# Patient Record
Sex: Female | Born: 1960 | Race: White | Hispanic: No | Marital: Married | Smoking: Never smoker
Health system: Southern US, Community
[De-identification: ages and names within clinical notes are randomized; demographics above are authoritative.]

## PROBLEM LIST (undated history)

## (undated) DIAGNOSIS — E785 Hyperlipidemia, unspecified: Secondary | ICD-10-CM

## (undated) DIAGNOSIS — Z8639 Personal history of other endocrine, nutritional and metabolic disease: Secondary | ICD-10-CM

## (undated) DIAGNOSIS — Z148 Genetic carrier of other disease: Secondary | ICD-10-CM

## (undated) DIAGNOSIS — E559 Vitamin D deficiency, unspecified: Secondary | ICD-10-CM

## (undated) DIAGNOSIS — R011 Cardiac murmur, unspecified: Secondary | ICD-10-CM

## (undated) DIAGNOSIS — I341 Nonrheumatic mitral (valve) prolapse: Secondary | ICD-10-CM

## (undated) DIAGNOSIS — C44310 Basal cell carcinoma of skin of unspecified parts of face: Secondary | ICD-10-CM

## (undated) DIAGNOSIS — C44311 Basal cell carcinoma of skin of nose: Secondary | ICD-10-CM

## (undated) DIAGNOSIS — I10 Essential (primary) hypertension: Secondary | ICD-10-CM

## (undated) HISTORY — DX: Vitamin D deficiency, unspecified: E55.9

## (undated) HISTORY — DX: Personal history of other endocrine, nutritional and metabolic disease: Z86.39

## (undated) HISTORY — PX: BASAL CELL CARCINOMA EXCISION: SHX1214

## (undated) HISTORY — DX: Essential (primary) hypertension: I10

## (undated) HISTORY — DX: Hyperlipidemia, unspecified: E78.5

## (undated) HISTORY — DX: Basal cell carcinoma of skin of unspecified parts of face: C44.310

## (undated) HISTORY — DX: Cardiac murmur, unspecified: R01.1

## (undated) HISTORY — DX: Nonrheumatic mitral (valve) prolapse: I34.1

## (undated) HISTORY — DX: Basal cell carcinoma of skin of nose: C44.311

## (undated) HISTORY — DX: Genetic carrier of other disease: Z14.8

---

## 2010-09-05 DIAGNOSIS — I059 Rheumatic mitral valve disease, unspecified: Secondary | ICD-10-CM | POA: Insufficient documentation

## 2011-10-01 DIAGNOSIS — Z85828 Personal history of other malignant neoplasm of skin: Secondary | ICD-10-CM | POA: Insufficient documentation

## 2015-11-28 DIAGNOSIS — Z1501 Genetic susceptibility to malignant neoplasm of breast: Secondary | ICD-10-CM | POA: Insufficient documentation

## 2015-11-29 DIAGNOSIS — IMO0002 Reserved for concepts with insufficient information to code with codable children: Secondary | ICD-10-CM | POA: Insufficient documentation

## 2015-11-29 DIAGNOSIS — Q999 Chromosomal abnormality, unspecified: Secondary | ICD-10-CM | POA: Insufficient documentation

## 2017-08-10 DIAGNOSIS — I10 Essential (primary) hypertension: Secondary | ICD-10-CM | POA: Diagnosis not present

## 2017-08-10 DIAGNOSIS — Z1501 Genetic susceptibility to malignant neoplasm of breast: Secondary | ICD-10-CM | POA: Diagnosis not present

## 2017-08-10 DIAGNOSIS — E041 Nontoxic single thyroid nodule: Secondary | ICD-10-CM | POA: Diagnosis not present

## 2017-08-10 DIAGNOSIS — C4490 Unspecified malignant neoplasm of skin, unspecified: Secondary | ICD-10-CM | POA: Diagnosis not present

## 2017-08-27 DIAGNOSIS — I10 Essential (primary) hypertension: Secondary | ICD-10-CM | POA: Diagnosis not present

## 2017-10-20 DIAGNOSIS — Z1502 Genetic susceptibility to malignant neoplasm of ovary: Secondary | ICD-10-CM | POA: Diagnosis not present

## 2017-10-20 DIAGNOSIS — Z780 Asymptomatic menopausal state: Secondary | ICD-10-CM | POA: Diagnosis not present

## 2017-10-20 DIAGNOSIS — L720 Epidermal cyst: Secondary | ICD-10-CM | POA: Diagnosis not present

## 2017-10-20 DIAGNOSIS — Z85828 Personal history of other malignant neoplasm of skin: Secondary | ICD-10-CM | POA: Diagnosis not present

## 2017-10-20 DIAGNOSIS — L738 Other specified follicular disorders: Secondary | ICD-10-CM | POA: Diagnosis not present

## 2017-10-20 DIAGNOSIS — C4490 Unspecified malignant neoplasm of skin, unspecified: Secondary | ICD-10-CM | POA: Diagnosis not present

## 2017-10-20 DIAGNOSIS — Z1501 Genetic susceptibility to malignant neoplasm of breast: Secondary | ICD-10-CM | POA: Diagnosis not present

## 2017-10-20 DIAGNOSIS — Z1589 Genetic susceptibility to other disease: Secondary | ICD-10-CM | POA: Diagnosis not present

## 2017-10-20 DIAGNOSIS — Z01419 Encounter for gynecological examination (general) (routine) without abnormal findings: Secondary | ICD-10-CM | POA: Diagnosis not present

## 2017-11-12 DIAGNOSIS — Z1231 Encounter for screening mammogram for malignant neoplasm of breast: Secondary | ICD-10-CM | POA: Diagnosis not present

## 2017-11-12 DIAGNOSIS — N6489 Other specified disorders of breast: Secondary | ICD-10-CM | POA: Diagnosis not present

## 2018-04-15 DIAGNOSIS — Z0000 Encounter for general adult medical examination without abnormal findings: Secondary | ICD-10-CM | POA: Diagnosis not present

## 2018-04-15 DIAGNOSIS — I10 Essential (primary) hypertension: Secondary | ICD-10-CM | POA: Diagnosis not present

## 2018-04-15 DIAGNOSIS — Z1501 Genetic susceptibility to malignant neoplasm of breast: Secondary | ICD-10-CM | POA: Insufficient documentation

## 2018-04-15 DIAGNOSIS — E041 Nontoxic single thyroid nodule: Secondary | ICD-10-CM | POA: Diagnosis not present

## 2018-04-15 DIAGNOSIS — Z13220 Encounter for screening for lipoid disorders: Secondary | ICD-10-CM | POA: Diagnosis not present

## 2018-04-15 DIAGNOSIS — E559 Vitamin D deficiency, unspecified: Secondary | ICD-10-CM | POA: Insufficient documentation

## 2018-04-15 DIAGNOSIS — Z23 Encounter for immunization: Secondary | ICD-10-CM | POA: Diagnosis not present

## 2018-04-15 DIAGNOSIS — Z1502 Genetic susceptibility to malignant neoplasm of ovary: Secondary | ICD-10-CM | POA: Insufficient documentation

## 2018-04-28 DIAGNOSIS — E041 Nontoxic single thyroid nodule: Secondary | ICD-10-CM | POA: Diagnosis not present

## 2018-04-28 DIAGNOSIS — Z1502 Genetic susceptibility to malignant neoplasm of ovary: Secondary | ICD-10-CM | POA: Diagnosis not present

## 2018-04-28 DIAGNOSIS — N6489 Other specified disorders of breast: Secondary | ICD-10-CM | POA: Diagnosis not present

## 2018-09-23 DIAGNOSIS — I10 Essential (primary) hypertension: Secondary | ICD-10-CM | POA: Insufficient documentation

## 2018-09-23 DIAGNOSIS — E559 Vitamin D deficiency, unspecified: Secondary | ICD-10-CM

## 2018-09-23 DIAGNOSIS — Z8639 Personal history of other endocrine, nutritional and metabolic disease: Secondary | ICD-10-CM | POA: Insufficient documentation

## 2018-09-23 DIAGNOSIS — I341 Nonrheumatic mitral (valve) prolapse: Secondary | ICD-10-CM

## 2018-09-28 ENCOUNTER — Ambulatory Visit: Payer: Self-pay | Admitting: Family Medicine

## 2019-01-31 ENCOUNTER — Other Ambulatory Visit: Payer: Self-pay

## 2019-02-01 ENCOUNTER — Ambulatory Visit (INDEPENDENT_AMBULATORY_CARE_PROVIDER_SITE_OTHER): Payer: BC Managed Care – PPO | Admitting: Family Medicine

## 2019-02-01 ENCOUNTER — Encounter: Payer: Self-pay | Admitting: Family Medicine

## 2019-02-01 VITALS — BP 123/75 | HR 68 | Temp 97.0°F | Ht 64.0 in | Wt 182.0 lb

## 2019-02-01 DIAGNOSIS — Z85828 Personal history of other malignant neoplasm of skin: Secondary | ICD-10-CM

## 2019-02-01 DIAGNOSIS — Z7689 Persons encountering health services in other specified circumstances: Secondary | ICD-10-CM

## 2019-02-01 DIAGNOSIS — Z6831 Body mass index (BMI) 31.0-31.9, adult: Secondary | ICD-10-CM

## 2019-02-01 DIAGNOSIS — E782 Mixed hyperlipidemia: Secondary | ICD-10-CM | POA: Insufficient documentation

## 2019-02-01 DIAGNOSIS — E785 Hyperlipidemia, unspecified: Secondary | ICD-10-CM | POA: Insufficient documentation

## 2019-02-01 DIAGNOSIS — I10 Essential (primary) hypertension: Secondary | ICD-10-CM

## 2019-02-01 DIAGNOSIS — E66811 Obesity, class 1: Secondary | ICD-10-CM | POA: Insufficient documentation

## 2019-02-01 DIAGNOSIS — Z1509 Genetic susceptibility to other malignant neoplasm: Secondary | ICD-10-CM

## 2019-02-01 DIAGNOSIS — Z1501 Genetic susceptibility to malignant neoplasm of breast: Secondary | ICD-10-CM

## 2019-02-01 DIAGNOSIS — Z1239 Encounter for other screening for malignant neoplasm of breast: Secondary | ICD-10-CM

## 2019-02-01 DIAGNOSIS — E669 Obesity, unspecified: Secondary | ICD-10-CM | POA: Insufficient documentation

## 2019-02-01 DIAGNOSIS — Z8639 Personal history of other endocrine, nutritional and metabolic disease: Secondary | ICD-10-CM

## 2019-02-01 DIAGNOSIS — Z13820 Encounter for screening for osteoporosis: Secondary | ICD-10-CM

## 2019-02-01 DIAGNOSIS — Z1589 Genetic susceptibility to other disease: Secondary | ICD-10-CM

## 2019-02-01 DIAGNOSIS — Z1502 Genetic susceptibility to malignant neoplasm of ovary: Secondary | ICD-10-CM

## 2019-02-01 DIAGNOSIS — E559 Vitamin D deficiency, unspecified: Secondary | ICD-10-CM | POA: Diagnosis not present

## 2019-02-01 MED ORDER — LISINOPRIL-HYDROCHLOROTHIAZIDE 20-25 MG PO TABS: 1.0000 | ORAL_TABLET | Freq: Every day | ORAL | 1 refills | Status: DC

## 2019-02-01 MED ORDER — ATORVASTATIN CALCIUM 20 MG PO TABS: 20.0000 mg | ORAL_TABLET | Freq: Every day | ORAL | 1 refills | Status: DC

## 2019-02-01 NOTE — Patient Instructions (Signed)
It was a pleasure seeing you today, Nijae.  Information regarding what we discussed is included in this packet.  Please make an appointment to see me in 3 months.   In a few days you may receive a survey in the mail or online from [employer] regarding your visit with [place] today. Please take a moment to fill this out. Your feedback is very important to our office. It can help [place] better understand your needs as well as improve your experience and satisfaction. Thank you for taking your time to complete it. We care about you.  Because of recent events of COVID-19 ("Coronavirus"), please follow CDC recommendations:   1. Wash your hand frequently 2. Avoid touching your face 3. Stay away from people who are sick 4. If you have symptoms such as fever, cough, shortness of breath then call your healthcare provider for further guidance 5. If you are sick, STAY AT HOME, unless otherwise directed by your healthcare provider. 6. Follow directions from state and national officials regarding staying safe    Please feel free to call our office if any questions or concerns arise.  Warm Regards, Monia Pouch, FNP-C [facility] [address] [place], [place] [address] [phone]

## 2019-02-01 NOTE — Progress Notes (Signed)
Subjective:  Patient ID: Katherine Pearson, female    DOB: 1961/01/14, 58 y.o.   MRN: [id number]  Patient Care Team: Baruch Gouty, FNP as PCP - General (Family Medicine)   Chief Complaint:  Establish Care, Medical Management of Chronic Issues, and Hypertension   HPI: Katherine Pearson is a 58 y.o. female presenting on 02/01/2019 for Establish Care, Medical Management of Chronic Issues, and Hypertension    1. Encounter to establish care  Pt presents today to establish care. Pt moved to the area from [facility]. Pt was seeing primary care physician at [facility]. Pt states she had previous medical care in [place]. Those records have been requested but have not been received. She said she has had a colonoscopy in the last 10 years. This is not available in Care everywhere. Will request records. Last DEXA was in 2018, results not available. Last PAP on 10/30/2017, negative for malignancy and HPV. Last mammogram 11/23/2017, negative. Last breast MRI 04/29/2018, negative. Mother has a history of breast cancer at age 66 and pt has mutation of CHEK2 gene. It was recommended she have MRI and mammogram yearly, alternating every 6 months. Will order mammogram today.    2. Essential hypertension Complaint with meds - Yes Checking BP at home - No Exercising Regularly - No Watching Salt intake - Yes Pertinent ROS:  Headache - No Chest pain - No Dyspnea - No Palpitations - No LE edema - No They report good compliance with medications and can restate their regimen by memory. No medication side effects.  BP Readings from Last 3 Encounters:  02/01/19 123/75    3. History of thyroid nodule  History of thyroid nodule. Due for repeat US on 04/29/2019. Last [place] on 04/28/2018 revealed a 0.9 x 0.9 x 0.9 cm calcified nodule in the right interpolar thyroid. Pt denies weight changes. No hot or cold intolerance. No mood swings. No hair, skin, or nail changes. No fatigue or insomnia. No constipation or  diarrhea.    4. Vitamin D deficiency  Daily oral repletion therapy. No fatigue, muscle weakness, fractures, or arthralgias.    5. Mixed hyperlipidemia  On statin without associated side effects. Does try to watch diet. Does not exercise on a regular basis.    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 Pt had genetic testing that was positive for CHEK2 mutation. Pt would like referral to GYN for her PAPs, last on 10/20/2017, negative for malignancy and HPV. Pt states she would prefer to see GYN for this. She is due for her yearly mammogram, will order today. No vaginal complaints. No breast changes, pain, or discharge. No fever, chills, diaphoresis, or weight loss.    7. History of primary malignant neoplasm of skin  BCC removed from nasal sidewall in 2013. No new or concerning skin lesions. Would like referral to dermatology for yearly screening.      Relevant past medical, surgical, family, and social history reviewed and updated as indicated.  Allergies and medications reviewed and updated. Date reviewed: Chart in Epic.   Past Medical History:  Diagnosis Date  . Basal cell carcinoma (BCC) of nasal sidewall   . 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 Heart murmur   . History of thyroid nodule    benign, left  . Hyperlipidemia   . Hypertension   . Mitral valve prolapse   . Vitamin D deficiency  Past Surgical History:  Procedure Laterality Date  . BASAL CELL CARCINOMA EXCISION      Social History   Socioeconomic History  . Marital status: Married    Spouse name: Gwyndolyn Saxon  . Number of children: 2  . Years of education: Not on file  . Highest education level: Not on file  Occupational History  . Not on file  Social Needs  . Financial resource strain: Not on file  . Food insecurity    Worry: Not on file    Inability: Not on file  . Transportation needs    Medical: Not on file    Non-medical: Not on file   Tobacco Use  . Smoking status: Never Smoker  . Smokeless tobacco: Never Used  Substance and Sexual Activity  . Alcohol use: Yes    Comment: social  . Drug use: Never  . Sexual activity: Not on file  Lifestyle  . Physical activity    Days per week: Not on file    Minutes per session: Not on file  . Stress: Not on file  Relationships  . Social Herbalist on phone: Not on file    Gets together: Not on file    Attends religious service: Not on file    Active member of club or organization: Not on file    Attends meetings of clubs or organizations: Not on file    Relationship status: Not on file  . Intimate partner violence    Fear of current or ex partner: Not on file    Emotionally abused: Not on file    Physically abused: Not on file    Forced sexual activity: Not on file  Other Topics Concern  . Not on file  Social History Narrative  . Not on file    Outpatient Encounter Medications as of 02/01/2019  Medication Sig  . atorvastatin (LIPITOR) 20 MG tablet Take 1 tablet (20 mg total) by mouth daily.  . Bilberry 100 MG CAPS Take by mouth.  . Calcium Carb-Cholecalciferol (CALCIUM 600 + D PO) Take by mouth. One a day - D is 800  . lisinopril-hydrochlorothiazide (ZESTORETIC) 20-25 MG tablet Take 1 tablet by mouth daily.  . Multiple Vitamins-Minerals (MULTIVITAMIN ADULT PO) multivitamin  . vitamin C (ASCORBIC ACID) 500 MG tablet Take 500 mg by mouth daily.  . [DISCONTINUED] atorvastatin (LIPITOR) 20 MG tablet Take 1 tablet by mouth daily.  . [DISCONTINUED] lisinopril-hydrochlorothiazide (ZESTORETIC) 20-25 MG tablet Take 1 tablet by mouth daily.   No facility-administered encounter medications on file as of 02/01/2019.     Allergies  Allergen Reactions  . Cefuroxime Axetil Rash    Review of Systems  Constitutional: Negative for activity change, appetite change, chills, diaphoresis, fatigue, fever and unexpected weight change.  HENT: Negative for congestion.    Eyes: Negative for photophobia and visual disturbance.  Respiratory: Negative for cough, chest tightness, shortness of breath and wheezing.   Cardiovascular: Negative for chest pain, palpitations and leg swelling.  Gastrointestinal: Negative for abdominal distention, abdominal pain, anal bleeding, blood in stool, constipation, diarrhea, nausea, rectal pain and vomiting.  Endocrine: Negative for cold intolerance, heat intolerance, polydipsia, polyphagia and polyuria.  Genitourinary: Negative for decreased urine volume, difficulty urinating, dyspareunia, dysuria, enuresis, flank pain, frequency, genital sores, hematuria, menstrual problem, pelvic pain, urgency, vaginal bleeding, vaginal discharge and vaginal pain.  Musculoskeletal: Negative for arthralgias, joint swelling and myalgias.  Skin: Negative for color change, pallor and wound.  Neurological: Negative for dizziness, tremors, seizures,  syncope, facial asymmetry, speech difficulty, weakness, light-headedness, numbness and headaches.  Hematological: Negative for adenopathy.  Psychiatric/Behavioral: Negative for confusion and sleep disturbance.  All other systems reviewed and are negative.       Objective:  BP 123/75   Pulse 68   Temp (!) 97 F (36.1 C) (Oral)   Ht _0  (1.626 m)   Wt 182 lb (82.6 kg)   BMI 31.24 kg/m    Wt Readings from Last 3 Encounters:  02/01/19 182 lb (82.6 kg)    Physical Exam Vitals signs and nursing note reviewed.  Constitutional:      General: She is not in acute distress.    Appearance: Normal appearance. She is well-developed and well-groomed. She is obese. She is not ill-appearing, toxic-appearing or diaphoretic.  HENT:     Head: Normocephalic and atraumatic.     Jaw: There is normal jaw occlusion.     Right Ear: Hearing normal.     Left Ear: Hearing normal.     Nose: Nose normal.     Mouth/Throat:     Lips: Pink.     Mouth: Mucous membranes are moist.     Pharynx: Oropharynx is clear.  Uvula midline.  Eyes:     General: Lids are normal.     Extraocular Movements: Extraocular movements intact.     Conjunctiva/sclera: Conjunctivae normal.     Pupils: Pupils are equal, round, and reactive to light.  Neck:     Musculoskeletal: Normal range of motion and neck supple.     Thyroid: Thyroid mass (small, solid mass, right mid thyroid, nontender) present. No thyromegaly or thyroid tenderness.     Vascular: No carotid bruit or JVD.     Trachea: Trachea and phonation normal.  Cardiovascular:     Rate and Rhythm: Normal rate and regular rhythm.     Chest Wall: PMI is not displaced.     Pulses: Normal pulses.     Heart sounds: Normal heart sounds. No murmur. No friction rub. No gallop.   Pulmonary:     Effort: Pulmonary effort is normal. No respiratory distress.     Breath sounds: Normal breath sounds. No wheezing.  Chest:     Breasts: Breasts are symmetrical.        Right: Normal.        Left: Normal.  Abdominal:     General: Bowel sounds are normal. There is no distension or abdominal bruit.     Palpations: Abdomen is soft. There is no hepatomegaly or splenomegaly.     Tenderness: There is no abdominal tenderness. There is no right CVA tenderness or left CVA tenderness.     Hernia: No hernia is present.  Musculoskeletal: Normal range of motion.     Right lower leg: No edema.     Left lower leg: No edema.  Lymphadenopathy:     Cervical: No cervical adenopathy.     Upper Body:     Right upper body: No supraclavicular or axillary adenopathy.     Left upper body: No supraclavicular or axillary adenopathy.  Skin:    General: Skin is warm and dry.     Capillary Refill: Capillary refill takes less than 2 seconds.     Coloration: Skin is not cyanotic, jaundiced or pale.     Findings: No rash.  Neurological:     General: No focal deficit present.     Mental Status: She is alert and oriented to person, place, and time.  Cranial Nerves: Cranial nerves are intact.      Sensory: Sensation is intact.     Motor: Motor function is intact.     Coordination: Coordination is intact.     Gait: Gait is intact.     Deep Tendon Reflexes: Reflexes are normal and symmetric.  Psychiatric:        Attention and Perception: Attention and perception normal.        Mood and Affect: Mood and affect normal.        Speech: Speech normal.        Behavior: Behavior normal. Behavior is cooperative.        Thought Content: Thought content normal.        Cognition and Memory: Cognition and memory normal.        Judgment: Judgment normal.     Results for orders placed or performed in visit on 02/01/19  CMP14+EGFR  Result Value Ref Range   Glucose 101 (H) 65 - 99 mg/dL   BUN 20 6 - 24 mg/dL   Creatinine, Ser 1.03 (H) 0.57 - 1.00 mg/dL   GFR calc non Af Amer 60 >59 mL/min/1.73   GFR calc Af Amer 69 >59 mL/min/1.73   BUN/Creatinine Ratio 19 9 - 23   Sodium 141 134 - 144 mmol/L   Potassium 3.7 3.5 - 5.2 mmol/L   Chloride 96 96 - 106 mmol/L   CO2 31 (H) 20 - 29 mmol/L   Calcium 9.8 8.7 - 10.2 mg/dL   Total Protein 7.4 6.0 - 8.5 g/dL   Albumin 4.6 3.8 - 4.9 g/dL   Globulin, Total 2.8 1.5 - 4.5 g/dL   Albumin/Globulin Ratio 1.6 1.2 - 2.2   Bilirubin Total 0.2 0.0 - 1.2 mg/dL   Alkaline Phosphatase 47 39 - 117 IU/L   AST 21 0 - 40 IU/L   ALT 22 0 - 32 IU/L  CBC with Differential/Platelet  Result Value Ref Range   WBC 6.1 3.4 - 10.8 x10E3/uL   RBC 4.63 3.77 - 5.28 x10E6/uL   Hemoglobin 14.4 11.1 - 15.9 g/dL   Hematocrit 42.1 34.0 - 46.6 %   MCV 91 79 - 97 fL   MCH 31.1 26.6 - 33.0 pg   MCHC 34.2 31.5 - 35.7 g/dL   RDW 12.0 11.7 - 15.4 %   Platelets 217 150 - 450 x10E3/uL   Neutrophils 45 Not Estab. %   Lymphs 42 Not Estab. %   Monocytes 10 Not Estab. %   Eos 2 Not Estab. %   Basos 1 Not Estab. %   Neutrophils Absolute 2.8 1.4 - 7.0 x10E3/uL   Lymphocytes Absolute 2.6 0.7 - 3.1 x10E3/uL   Monocytes Absolute 0.6 0.1 - 0.9 x10E3/uL   EOS (ABSOLUTE) 0.1 0.0 - 0.4  x10E3/uL   Basophils Absolute 0.0 0.0 - 0.2 x10E3/uL   Immature Granulocytes 0 Not Estab. %   Immature Grans (Abs) 0.0 0.0 - 0.1 x10E3/uL  Lipid panel  Result Value Ref Range   Cholesterol, Total WILL FOLLOW    Triglycerides WILL FOLLOW    HDL WILL FOLLOW    VLDL Cholesterol Cal WILL FOLLOW    LDL Calculated WILL FOLLOW    Comment: WILL FOLLOW    Chol/HDL Ratio WILL FOLLOW   Thyroid Panel With TSH  Result Value Ref Range   TSH WILL FOLLOW    T4, Total WILL FOLLOW    T3 Uptake Ratio WILL FOLLOW    Free Thyroxine Index WILL FOLLOW  VITAMIN D 25 Hydroxy (Vit-D Deficiency, Fractures)  Result Value Ref Range   Vit D, 25-Hydroxy WILL FOLLOW        Pertinent labs & imaging results that were available during my care of the patient were reviewed by me and considered in my medical decision making.  Assessment & Plan:  Leota was seen today for establish care, medical management of chronic issues and hypertension.  Diagnoses and all orders for this visit:  Encounter to establish care  Essential hypertension DASH diet and exercise encouraged. Labs pending. Continue medications as prescribed. Follow up in 3 months.  -     lisinopril-hydrochlorothiazide (ZESTORETIC) 20-25 MG tablet; Take 1 tablet by mouth daily. -     CMP14+EGFR -     CBC with Differential/Platelet -     Thyroid Panel With TSH  History of thyroid nodule Asymptomatic. Last [place] 04/28/2018, due for follow up US October 2020.  -     Thyroid Panel With TSH  Vitamin D deficiency On daily oral repletion. Will check levels today. Due for DEXA, will order. Continue repletion therapy.  -     VITAMIN D 25 Hydroxy (Vit-D Deficiency, Fractures)  Mixed hyperlipidemia Diet and exercise encouraged. Labs pending. Continue medications as prescribed.  -     atorvastatin (LIPITOR) 20 MG tablet; Take 1 tablet (20 mg total) by mouth daily. -     CMP14+EGFR -     Lipid panel  Monoallelic mutation of CHEK2 gene in female patient Pt  requested referral to GYN in area for yearly evaluation and PAP. Referral placed. Due for mammogram, order placed.  -     Ambulatory referral to Obstetrics / Gynecology -     MM 3D SCREEN BREAST BILATERAL; Future  History of primary malignant neoplasm of skin -     Ambulatory referral to Dermatology  Screening for breast cancer -     MM 3D SCREEN BREAST BILATERAL; Future  BMI 31.0-31.9,adult Diet and exercise encouraged. Follow up BMI in 3 months.   Counseling on health maintenance:  Healthy lifestyle principles reviewed (do not smoke; BMI < 30; at least 150 minutes of aerobic exercise per week; 5 servings of fruits or vegetables daily).  Counseled on mammography screening. Recommended adequate intake of calcium and vitamin D daily.  Counseled on DEXA scan screening for osteoporosis.  Counseled on colonoscopy screening and PAP for cervical cancer screening.  Recommend to use sunscreen and wear seatbelt. Recommend safe sex practices and use of condoms.   Continue all other maintenance medications.  Follow up plan: Return in about 3 months (around 05/04/2019), or if symptoms worsen or fail to improve, for HTN, lipids.  Educational handout given for survey  The above assessment and management plan was discussed with the patient. The patient verbalized understanding of and has agreed to the management plan. Patient is aware to call the clinic if symptoms persist or worsen. Patient is aware when to return to the clinic for a follow-up visit. Patient educated on when it is appropriate to go to the emergency department.   Monia Pouch, FNP-C [facility] [phone] 02/01/19

## 2019-02-02 ENCOUNTER — Encounter: Payer: Self-pay | Admitting: Family Medicine

## 2019-02-02 LAB — CBC WITH DIFFERENTIAL/PLATELET
Basophils Absolute: 0 10*3/uL (ref 0.0–0.2)
Basos: 1 %
EOS (ABSOLUTE): 0.1 10*3/uL (ref 0.0–0.4)
Eos: 2 %
Hematocrit: 42.1 % (ref 34.0–46.6)
Hemoglobin: 14.4 g/dL (ref 11.1–15.9)
Immature Grans (Abs): 0 10*3/uL (ref 0.0–0.1)
Immature Granulocytes: 0 %
Lymphocytes Absolute: 2.6 10*3/uL (ref 0.7–3.1)
Lymphs: 42 %
MCH: 31.1 pg (ref 26.6–33.0)
MCHC: 34.2 g/dL (ref 31.5–35.7)
MCV: 91 fL (ref 79–97)
Monocytes Absolute: 0.6 10*3/uL (ref 0.1–0.9)
Monocytes: 10 %
Neutrophils Absolute: 2.8 10*3/uL (ref 1.4–7.0)
Neutrophils: 45 %
Platelets: 217 10*3/uL (ref 150–450)
RBC: 4.63 x10E6/uL (ref 3.77–5.28)
RDW: 12 % (ref 11.7–15.4)
WBC: 6.1 10*3/uL (ref 3.4–10.8)

## 2019-02-02 LAB — CMP14+EGFR
ALT: 22 IU/L (ref 0–32)
AST: 21 IU/L (ref 0–40)
Albumin/Globulin Ratio: 1.6 (ref 1.2–2.2)
Albumin: 4.6 g/dL (ref 3.8–4.9)
Alkaline Phosphatase: 47 IU/L (ref 39–117)
BUN/Creatinine Ratio: 19 (ref 9–23)
BUN: 20 mg/dL (ref 6–24)
Bilirubin Total: 0.2 mg/dL (ref 0.0–1.2)
CO2: 31 mmol/L — ABNORMAL HIGH (ref 20–29)
Calcium: 9.8 mg/dL (ref 8.7–10.2)
Chloride: 96 mmol/L (ref 96–106)
Creatinine, Ser: 1.03 mg/dL — ABNORMAL HIGH (ref 0.57–1.00)
GFR calc Af Amer: 69 mL/min/{1.73_m2} (ref >59)
GFR calc non Af Amer: 60 mL/min/{1.73_m2} (ref >59)
Globulin, Total: 2.8 g/dL (ref 1.5–4.5)
Glucose: 101 mg/dL — ABNORMAL HIGH (ref 65–99)
Potassium: 3.7 mmol/L (ref 3.5–5.2)
Sodium: 141 mmol/L (ref 134–144)
Total Protein: 7.4 g/dL (ref 6.0–8.5)

## 2019-02-02 LAB — LIPID PANEL
Chol/HDL Ratio: 3.3 ratio (ref 0.0–4.4)
Cholesterol, Total: 190 mg/dL (ref 100–199)
HDL: 58 mg/dL (ref >39)
LDL Calculated: 112 mg/dL — ABNORMAL HIGH (ref 0–99)
Triglycerides: 98 mg/dL (ref 0–149)
VLDL Cholesterol Cal: 20 mg/dL (ref 5–40)

## 2019-02-02 LAB — THYROID PANEL WITH TSH
Free Thyroxine Index: 2 (ref 1.2–4.9)
T3 Uptake Ratio: 24 % (ref 24–39)
T4, Total: 8.3 ug/dL (ref 4.5–12.0)
TSH: 1.69 u[IU]/mL (ref 0.450–4.500)

## 2019-02-02 LAB — VITAMIN D 25 HYDROXY (VIT D DEFICIENCY, FRACTURES): Vit D, 25-Hydroxy: 48.8 ng/mL (ref 30.0–100.0)

## 2019-02-16 ENCOUNTER — Encounter: Payer: BC Managed Care – PPO | Admitting: Obstetrics and Gynecology

## 2019-02-16 DIAGNOSIS — L821 Other seborrheic keratosis: Secondary | ICD-10-CM | POA: Diagnosis not present

## 2019-02-16 DIAGNOSIS — D225 Melanocytic nevi of trunk: Secondary | ICD-10-CM | POA: Diagnosis not present

## 2019-02-16 DIAGNOSIS — L308 Other specified dermatitis: Secondary | ICD-10-CM | POA: Diagnosis not present

## 2019-02-16 DIAGNOSIS — L728 Other follicular cysts of the skin and subcutaneous tissue: Secondary | ICD-10-CM | POA: Diagnosis not present

## 2019-03-17 ENCOUNTER — Other Ambulatory Visit: Payer: Self-pay

## 2019-03-18 ENCOUNTER — Encounter: Payer: Self-pay | Admitting: Family Medicine

## 2019-03-18 ENCOUNTER — Ambulatory Visit: Payer: BC Managed Care – PPO | Admitting: Family Medicine

## 2019-03-18 VITALS — BP 116/76 | HR 76 | Temp 98.2°F | Ht 64.0 in | Wt 180.0 lb

## 2019-03-18 DIAGNOSIS — Z0001 Encounter for general adult medical examination with abnormal findings: Secondary | ICD-10-CM

## 2019-03-18 DIAGNOSIS — Z23 Encounter for immunization: Secondary | ICD-10-CM

## 2019-03-18 DIAGNOSIS — Z1211 Encounter for screening for malignant neoplasm of colon: Secondary | ICD-10-CM

## 2019-03-18 DIAGNOSIS — N898 Other specified noninflammatory disorders of vagina: Secondary | ICD-10-CM

## 2019-03-18 DIAGNOSIS — Z13220 Encounter for screening for lipoid disorders: Secondary | ICD-10-CM

## 2019-03-18 DIAGNOSIS — Z0000 Encounter for general adult medical examination without abnormal findings: Secondary | ICD-10-CM

## 2019-03-18 DIAGNOSIS — E041 Nontoxic single thyroid nodule: Secondary | ICD-10-CM | POA: Diagnosis not present

## 2019-03-18 DIAGNOSIS — Z130 Encounter for screening for diseases of the blood and blood-forming organs and certain disorders involving the immune mechanism: Secondary | ICD-10-CM

## 2019-03-18 DIAGNOSIS — Z13228 Encounter for screening for other metabolic disorders: Secondary | ICD-10-CM | POA: Diagnosis not present

## 2019-03-18 DIAGNOSIS — Z1329 Encounter for screening for other suspected endocrine disorder: Secondary | ICD-10-CM

## 2019-03-18 DIAGNOSIS — Z124 Encounter for screening for malignant neoplasm of cervix: Secondary | ICD-10-CM

## 2019-03-18 DIAGNOSIS — Z6831 Body mass index (BMI) 31.0-31.9, adult: Secondary | ICD-10-CM | POA: Diagnosis not present

## 2019-03-18 DIAGNOSIS — Z1212 Encounter for screening for malignant neoplasm of rectum: Secondary | ICD-10-CM

## 2019-03-18 DIAGNOSIS — Z1239 Encounter for other screening for malignant neoplasm of breast: Secondary | ICD-10-CM

## 2019-03-18 LAB — WET PREP FOR TRICH, YEAST, CLUE
Clue Cell Exam: NEGATIVE
Trichomonas Exam: NEGATIVE
Yeast Exam: NEGATIVE

## 2019-03-18 NOTE — Patient Instructions (Signed)
Health Maintenance, Female Adopting a healthy lifestyle and getting preventive care are important in promoting health and wellness. Ask your health care provider about:  The right schedule for you to have regular tests and exams.  Things you can do on your own to prevent diseases and keep yourself healthy. What should I know about diet, weight, and exercise? Eat a healthy diet   Eat a diet that includes plenty of vegetables, fruits, low-fat dairy products, and lean protein.  Do not eat a lot of foods that are high in solid fats, added sugars, or sodium. Maintain a healthy weight Body mass index (BMI) is used to identify weight problems. It estimates body fat based on height and weight. Your health care provider can help determine your BMI and help you achieve or maintain a healthy weight. Get regular exercise Get regular exercise. This is one of the most important things you can do for your health. Most adults should:  Exercise for at least 150 minutes each week. The exercise should increase your heart rate and make you sweat (moderate-intensity exercise).  Do strengthening exercises at least twice a week. This is in addition to the moderate-intensity exercise.  Spend less time sitting. Even light physical activity can be beneficial. Watch cholesterol and blood lipids Have your blood tested for lipids and cholesterol at 58 years of age, then have this test every 5 years. Have your cholesterol levels checked more often if:  Your lipid or cholesterol levels are high.  You are older than 58 years of age.  You are at high risk for heart disease. What should I know about cancer screening? Depending on your health history and family history, you may need to have cancer screening at various ages. This may include screening for:  Breast cancer.  Cervical cancer.  Colorectal cancer.  Skin cancer.  Lung cancer. What should I know about heart disease, diabetes, and high blood  pressure? Blood pressure and heart disease  High blood pressure causes heart disease and increases the risk of stroke. This is more likely to develop in people who have high blood pressure readings, are of African descent, or are overweight.  Have your blood pressure checked: ? Every 3-5 years if you are 54-9 years of age. ? Every year if you are 69 years old or older. Diabetes Have regular diabetes screenings. This checks your fasting blood sugar level. Have the screening done:  Once every three years after age 36 if you are at a normal weight and have a low risk for diabetes.  More often and at a younger age if you are overweight or have a high risk for diabetes. What should I know about preventing infection? Hepatitis B If you have a higher risk for hepatitis B, you should be screened for this virus. Talk with your health care provider to find out if you are at risk for hepatitis B infection. Hepatitis C Testing is recommended for:  Everyone born from [birth year] through 1965.  Anyone with known risk factors for hepatitis C. Sexually transmitted infections (STIs)  Get screened for STIs, including gonorrhea and chlamydia, if: ? You are sexually active and are younger than 58 years of age. ? You are older than 58 years of age and your health care provider tells you that you are at risk for this type of infection. ? Your sexual activity has changed since you were last screened, and you are at increased risk for chlamydia or gonorrhea. Ask your health care provider  if you are at risk.  Ask your health care provider about whether you are at high risk for HIV. Your health care provider may recommend a prescription medicine to help prevent HIV infection. If you choose to take medicine to prevent HIV, you should first get tested for HIV. You should then be tested every 3 months for as long as you are taking the medicine. Pregnancy  If you are about to stop having your period (premenopausal) and  you may become pregnant, seek counseling before you get pregnant.  Take 400 to 800 micrograms (mcg) of folic acid every day if you become pregnant.  Ask for birth control (contraception) if you want to prevent pregnancy. Osteoporosis and menopause Osteoporosis is a disease in which the bones lose minerals and strength with aging. This can result in bone fractures. If you are 60 years old or older, or if you are at risk for osteoporosis and fractures, ask your health care provider if you should:  Be screened for bone loss.  Take a calcium or vitamin D supplement to lower your risk of fractures.  Be given hormone replacement therapy (HRT) to treat symptoms of menopause. Follow these instructions at home: Lifestyle  Do not use any products that contain nicotine or tobacco, such as cigarettes, e-cigarettes, and chewing tobacco. If you need help quitting, ask your health care provider.  Do not use street drugs.  Do not share needles.  Ask your health care provider for help if you need support or information about quitting drugs. Alcohol use  Do not drink alcohol if: ? Your health care provider tells you not to drink. ? You are pregnant, may be pregnant, or are planning to become pregnant.  If you drink alcohol: ? Limit how much you use to 0-1 drink a day. ? Limit intake if you are breastfeeding.  Be aware of how much alcohol is in your drink. In the [place] one drink equals one 12 oz bottle of beer (355 mL), one 5 oz glass of wine (148 mL), or one 1 oz glass of hard liquor (44 mL). General instructions  Schedule regular health, dental, and eye exams.  Stay current with your vaccines.  Tell your health care provider if: ? You often feel depressed. ? You have ever been abused or do not feel safe at home. Summary  Adopting a healthy lifestyle and getting preventive care are important in promoting health and wellness.  Follow your health care provider's instructions about healthy  diet, exercising, and getting tested or screened for diseases.  Follow your health care provider's instructions on monitoring your cholesterol and blood pressure. This information is not intended to replace advice given to you by your health care provider. Make sure you discuss any questions you have with your health care provider. Document Released: 01/13/2011 Document Revised: 06/23/2018 Document Reviewed: 06/23/2018 Elsevier Patient Education  2020 [employer].   Colonoscopy, Adult A colonoscopy is an exam to look at the entire large intestine. During the exam, a lubricated, flexible tube that has a camera on the end of it is inserted into the anus and then passed into the rectum, colon, and other parts of the large intestine. You may have a colonoscopy as a part of normal colorectal screening or if you have certain symptoms, such as:  Lack of red blood cells (anemia).  Diarrhea that does not go away.  Abdominal pain.  Blood in your stool (feces). A colonoscopy can help screen for and diagnose medical problems, including:  Tumors.  Polyps.  Inflammation.  Areas of bleeding. Tell a health care provider about:  Any allergies you have.  All medicines you are taking, including vitamins, herbs, eye drops, creams, and over-the-counter medicines.  Any problems you or family members have had with anesthetic medicines.  Any blood disorders you have.  Any surgeries you have had.  Any medical conditions you have.  Any problems you have had passing stool. What are the risks? Generally, this is a safe procedure. However, problems may occur, including:  Bleeding.  A tear in the intestine.  A reaction to medicines given during the exam.  Infection (rare). What happens before the procedure? Eating and drinking restrictions Follow instructions from your health care provider about eating and drinking, which may include:  A few days before the procedure - follow a low-fiber  diet. Avoid nuts, seeds, dried fruit, raw fruits, and vegetables.  1-3 days before the procedure - follow a clear liquid diet. Drink only clear liquids, such as clear broth or bouillon, black coffee or tea, clear juice, clear soft drinks or sports drinks, gelatin dessert, and popsicles. Avoid any liquids that contain red or purple dye.  On the day of the procedure - do not eat or drink anything starting 2 hours before the procedure, or within the time period that your health care provider recommends. Up to 2 hours before the procedure, you may continue to drink clear liquids, such as water or clear fruit juice. Bowel prep If you were prescribed an oral bowel prep to clean out your colon:  Take it as told by your health care provider. Starting the day before your procedure, you will need to drink a large amount of medicated liquid. The liquid will cause you to have multiple loose stools until your stool is almost clear or light green.  If your skin or anus gets irritated from diarrhea, you may use these to relieve the irritation: ? Medicated wipes, such as adult wet wipes with aloe and vitamin E. ? A skin-soothing product like petroleum jelly.  If you vomit while drinking the bowel prep, take a break for up to 60 minutes and then begin the bowel prep again. If vomiting continues and you cannot take the bowel prep without vomiting, call your health care provider.  To clean out your colon, you may also be given: ? Laxative medicines. ? Instructions about how to use an enema. General instructions  Ask your health care provider about: ? Changing or stopping your regular medicines or supplements. This is especially important if you are taking iron supplements, diabetes medicines, or blood thinners. ? Taking medicines such as aspirin and ibuprofen. These medicines can thin your blood. Do not take these medicines before the procedure if your health care provider tells you not to.  Plan to have  someone take you home from the hospital or clinic. What happens during the procedure?   An IV may be inserted into one of your veins.  You will be given medicine to help you relax (sedative).  To reduce your risk of infection: ? Your health care team will wash or sanitize their hands. ? Your anal area will be washed with soap.  You will be asked to lie on your side with your knees bent.  Your health care provider will lubricate a long, thin, flexible tube. The tube will have a camera and a light on the end.  The tube will be inserted into your anus.  The tube will be gently  eased through your rectum and colon.  Air will be delivered into your colon to keep it open. You may feel some pressure or cramping.  The camera will be used to take images during the procedure.  A small tissue sample may be removed to be examined under a microscope (biopsy).  If small polyps are found, your health care provider may remove them and have them checked for cancer cells.  When the exam is done, the tube will be removed. The procedure may vary among health care providers and hospitals. What happens after the procedure?  Your blood pressure, heart rate, breathing rate, and blood oxygen level will be monitored until the medicines you were given have worn off.  Do not drive for 24 hours after the exam.  You may have a small amount of blood in your stool.  You may pass gas and have mild abdominal cramping or bloating due to the air that was used to inflate your colon during the exam.  It is up to you to get the results of your procedure. Ask your health care provider, or the department performing the procedure, when your results will be ready. Summary  A colonoscopy is an exam to look at the entire large intestine.  During a colonoscopy, a lubricated, flexible tube with a camera on the end of it is inserted into the anus and then passed into the colon and other parts of the large intestine.   Follow instructions from your health care provider about eating and drinking before the procedure.  If you were prescribed an oral bowel prep to clean out your colon, take it as told by your health care provider.  After your procedure, your blood pressure, heart rate, breathing rate, and blood oxygen level will be monitored until the medicines you were given have worn off. This information is not intended to replace advice given to you by your health care provider. Make sure you discuss any questions you have with your health care provider. Document Released: 06/27/2000 Document Revised: 04/22/2017 Document Reviewed: 09/11/2015 Elsevier Patient Education  2020 [employer].

## 2019-03-18 NOTE — Progress Notes (Signed)
Subjective:  Patient ID: Katherine Pearson, female    DOB: [birth year], 58 y.o.   MRN: [id number]  Patient Care Team: Baruch Gouty, FNP as PCP - General (Family Medicine)   Chief Complaint:  Gynecologic Exam and Annual Exam   HPI: Katherine Pearson is a 58 y.o. female presenting on 03/18/2019 for Gynecologic Exam and Annual Exam   Pt presents today for has annual physical exam and preventative health maintenance. Pt has been doing well. She is taking her medications as prescribed without associated side effects. She denies high or low blood pressure readings. No chest pain, leg swelling, shortness of breath, headaches, confusion, weakness, dizziness, or visual changes. She does try to watch her diet and is active daily. She has a noted right thyroid nodule that is followed by [place] yearly and it is time for this. She denies increase in size of nodule. No thyroid associated symptoms. No trouble swallowing or voice changes. She does report slight vaginal discharge that is white in color. States this started a few days ago and has not worsened. Denies new sexual partner or recent antibiotic use. No changes in hygiene products. No pain, bleeding, pruritis, or odor. No dyspareunia, abdominal pain, or pelvic pain.     Relevant past medical, surgical, family, and social history reviewed and updated as indicated.  Allergies and medications reviewed and updated. Date reviewed: Chart in Epic.   Past Medical History:  Diagnosis Date  . Basal cell carcinoma (BCC) of nasal sidewall   . 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 Heart murmur   . History of thyroid nodule    benign, left  . Hyperlipidemia   . Hypertension   . Mitral valve prolapse   . Vitamin D deficiency     Past Surgical History:  Procedure Laterality Date  . BASAL CELL CARCINOMA EXCISION      Social History   Socioeconomic History  . Marital status: Married    Spouse name: Gwyndolyn Saxon  . Number of children: 2  . Years of  education: Not on file  . Highest education level: Not on file  Occupational History  . Not on file  Social Needs  . Financial resource strain: Not on file  . Food insecurity    Worry: Not on file    Inability: Not on file  . Transportation needs    Medical: Not on file    Non-medical: Not on file  Tobacco Use  . Smoking status: Never Smoker  . Smokeless tobacco: Never Used  Substance and Sexual Activity  . Alcohol use: Yes    Comment: social  . Drug use: Never  . Sexual activity: Not on file  Lifestyle  . Physical activity    Days per week: Not on file    Minutes per session: Not on file  . Stress: Not on file  Relationships  . Social Herbalist on phone: Not on file    Gets together: Not on file    Attends religious service: Not on file    Active member of club or organization: Not on file    Attends meetings of clubs or organizations: Not on file    Relationship status: Not on file  . Intimate partner violence    Fear of current or ex partner: Not on file    Emotionally abused: Not on file    Physically abused: Not on file    Forced sexual activity: Not on  file  Other Topics Concern  . Not on file  Social History Narrative  . Not on file    Outpatient Encounter Medications as of 03/18/2019  Medication Sig  . atorvastatin (LIPITOR) 20 MG tablet Take 1 tablet (20 mg total) by mouth daily.  . Bilberry 100 MG CAPS Take by mouth.  . Calcium Carb-Cholecalciferol (CALCIUM 600 + D PO) Take by mouth. One a day - D is 800  . lisinopril-hydrochlorothiazide (ZESTORETIC) 20-25 MG tablet Take 1 tablet by mouth daily.  . Multiple Vitamins-Minerals (MULTIVITAMIN ADULT PO) multivitamin  . vitamin C (ASCORBIC ACID) 500 MG tablet Take 500 mg by mouth daily.   No facility-administered encounter medications on file as of 03/18/2019.     Allergies  Allergen Reactions  . Cefuroxime Axetil Rash    Review of Systems  Constitutional: Negative for activity change, appetite  change, chills, diaphoresis, fatigue, fever and unexpected weight change.  HENT: Negative.   Eyes: Negative.  Negative for photophobia and visual disturbance.  Respiratory: Negative for cough, chest tightness and shortness of breath.   Cardiovascular: Negative for chest pain, palpitations and leg swelling.  Gastrointestinal: Negative for abdominal distention, abdominal pain, anal bleeding, blood in stool, constipation, diarrhea, nausea, rectal pain and vomiting.  Endocrine: Negative.  Negative for cold intolerance, heat intolerance, polydipsia, polyphagia and polyuria.  Genitourinary: Positive for vaginal discharge. Negative for decreased urine volume, difficulty urinating, dyspareunia, dysuria, enuresis, flank pain, frequency, genital sores, hematuria, pelvic pain, urgency, vaginal bleeding and vaginal pain.  Musculoskeletal: Negative for arthralgias, back pain and myalgias.  Skin: Negative.   Allergic/Immunologic: Negative.   Neurological: Negative for dizziness, tremors, seizures, syncope, facial asymmetry, speech difficulty, weakness, light-headedness, numbness and headaches.  Hematological: Negative.   Psychiatric/Behavioral: Negative for confusion, hallucinations, sleep disturbance and suicidal ideas. The patient is not nervous/anxious.   All other systems reviewed and are negative.       Objective:  BP 116/76   Pulse 76   Temp 98.2 F (36.8 C)   Ht '5\' 4"'$  (1.626 m)   Wt 180 lb (81.6 kg)   SpO2 98%   BMI 30.90 kg/m    Wt Readings from Last 3 Encounters:  03/18/19 180 lb (81.6 kg)  02/01/19 182 lb (82.6 kg)    Physical Exam Vitals signs and nursing note reviewed.  Constitutional:      General: She is not in acute distress.    Appearance: Normal appearance. She is well-developed and well-groomed. She is obese. She is not ill-appearing, toxic-appearing or diaphoretic.  HENT:     Head: Normocephalic and atraumatic.     Jaw: There is normal jaw occlusion.     Right Ear:  Hearing, tympanic membrane, ear canal and external ear normal.     Left Ear: Hearing, tympanic membrane, ear canal and external ear normal.     Nose: Nose normal.     Mouth/Throat:     Lips: Pink.     Mouth: Mucous membranes are moist.     Pharynx: Oropharynx is clear. Uvula midline.  Eyes:     General: Lids are normal.     Extraocular Movements: Extraocular movements intact.     Conjunctiva/sclera: Conjunctivae normal.     Pupils: Pupils are equal, round, and reactive to light.  Neck:     Musculoskeletal: Normal range of motion and neck supple.     Thyroid: Thyroid mass (small solid nodule to mid right thyroid, nontender) present. No thyromegaly or thyroid tenderness.     Vascular: No  carotid bruit or JVD.     Trachea: Trachea and phonation normal.  Cardiovascular:     Rate and Rhythm: Normal rate and regular rhythm.     Chest Wall: PMI is not displaced.     Pulses: Normal pulses.          Dorsalis pedis pulses are 2+ on the right side and 2+ on the left side.       Posterior tibial pulses are 2+ on the right side and 2+ on the left side.     Heart sounds: Normal heart sounds. No murmur. No friction rub. No gallop.   Pulmonary:     Effort: Pulmonary effort is normal. No respiratory distress.     Breath sounds: Normal breath sounds. No wheezing.  Chest:     Chest wall: No mass, lacerations, deformity, swelling, tenderness, crepitus or edema.     Breasts: Tanner Score is 5. Breasts are symmetrical.        Right: Normal.        Left: Normal.  Abdominal:     General: Abdomen is protuberant. Bowel sounds are normal. There is no distension or abdominal bruit. There are no signs of injury.     Palpations: Abdomen is soft. There is no shifting dullness, fluid wave, hepatomegaly, splenomegaly, mass or pulsatile mass.     Tenderness: There is no abdominal tenderness. There is no right CVA tenderness or left CVA tenderness.     Hernia: No hernia is present. There is no hernia in the left  inguinal area or right inguinal area.  Genitourinary:    General: Normal vulva.     Exam position: Lithotomy position.     Pubic Area: No rash or pubic lice.      Tanner stage (genital): 5.     Labia:        Right: No rash, tenderness, lesion or injury.        Left: No rash, tenderness, lesion or injury.      Urethra: No prolapse, urethral pain, urethral swelling or urethral lesion.     Vagina: No signs of injury and foreign body. Vaginal discharge (scant yellowish-white discharge) present. No erythema, tenderness, bleeding, lesions or prolapsed vaginal walls.     Cervix: Discharge and friability present. No cervical motion tenderness, lesion, erythema, cervical bleeding or eversion.     Uterus: Normal.      Adnexa: Right adnexa normal and left adnexa normal.     Rectum: Normal.     Musculoskeletal: Normal range of motion.     Right lower leg: No edema.     Left lower leg: No edema.     Right foot: Normal range of motion. No deformity.     Left foot: Normal range of motion. No deformity.  Feet:     Right foot:     Protective Sensation: 10 sites tested. 10 sites sensed.     Skin integrity: Skin integrity normal.     Left foot:     Protective Sensation: 10 sites tested. 10 sites sensed.     Skin integrity: Skin integrity normal.  Lymphadenopathy:     Head:     Right side of head: No submental, submandibular, tonsillar, preauricular, posterior auricular or occipital adenopathy.     Left side of head: No submental, submandibular, tonsillar, preauricular, posterior auricular or occipital adenopathy.     Cervical: No cervical adenopathy.     Upper Body:     Right upper body: No supraclavicular, axillary or pectoral  adenopathy.     Left upper body: No supraclavicular, axillary or pectoral adenopathy.     Lower Body: No right inguinal adenopathy. No left inguinal adenopathy.  Skin:    General: Skin is warm and dry.     Capillary Refill: Capillary refill takes less than 2 seconds.      Coloration: Skin is not cyanotic, jaundiced or pale.     Findings: No rash.  Neurological:     General: No focal deficit present.     Mental Status: She is alert and oriented to person, place, and time.     Cranial Nerves: Cranial nerves are intact. No cranial nerve deficit.     Sensory: Sensation is intact. No sensory deficit.     Motor: Motor function is intact. No weakness.     Coordination: Coordination is intact. Coordination normal.     Gait: Gait is intact. Gait normal.     Deep Tendon Reflexes: Reflexes are normal and symmetric. Reflexes normal.  Psychiatric:        Attention and Perception: Attention and perception normal.        Mood and Affect: Mood and affect normal.        Speech: Speech normal.        Behavior: Behavior normal. Behavior is cooperative.        Thought Content: Thought content normal.        Cognition and Memory: Cognition and memory normal.        Judgment: Judgment normal.     Results for orders placed or performed in visit on 03/18/19  WET PREP FOR [place], YEAST, CLUE   Specimen: Vaginal Fluid   VAGINAL FLUI  Result Value Ref Range   Trichomonas Exam Negative Negative   Yeast Exam Negative Negative   Clue Cell Exam Negative Negative  CMP14+EGFR  Result Value Ref Range   Glucose 92 65 - 99 mg/dL   BUN 19 6 - 24 mg/dL   Creatinine, Ser 0.87 0.57 - 1.00 mg/dL   GFR calc non Af Amer 74 >59 mL/min/1.73   GFR calc Af Amer 85 >59 mL/min/1.73   BUN/Creatinine Ratio 22 9 - 23   Sodium 141 134 - 144 mmol/L   Potassium 4.4 3.5 - 5.2 mmol/L   Chloride 98 96 - 106 mmol/L   CO2 31 (H) 20 - 29 mmol/L   Calcium 10.2 8.7 - 10.2 mg/dL   Total Protein 7.3 6.0 - 8.5 g/dL   Albumin 4.4 3.8 - 4.9 g/dL   Globulin, Total 2.9 1.5 - 4.5 g/dL   Albumin/Globulin Ratio 1.5 1.2 - 2.2   Bilirubin Total 0.4 0.0 - 1.2 mg/dL   Alkaline Phosphatase 46 39 - 117 IU/L   AST 23 0 - 40 IU/L   ALT 23 0 - 32 IU/L  CBC with Differential/Platelet  Result Value Ref Range   WBC  5.1 3.4 - 10.8 x10E3/uL   RBC 4.64 3.77 - 5.28 x10E6/uL   Hemoglobin 14.5 11.1 - 15.9 g/dL   Hematocrit 42.8 34.0 - 46.6 %   MCV 92 79 - 97 fL   MCH 31.3 26.6 - 33.0 pg   MCHC 33.9 31.5 - 35.7 g/dL   RDW 12.0 11.7 - 15.4 %   Platelets 234 150 - 450 x10E3/uL   Neutrophils 51 Not Estab. %   Lymphs 37 Not Estab. %   Monocytes 9 Not Estab. %   Eos 2 Not Estab. %   Basos 1 Not Estab. %   Neutrophils  Absolute 2.6 1.4 - 7.0 x10E3/uL   Lymphocytes Absolute 1.9 0.7 - 3.1 x10E3/uL   Monocytes Absolute 0.5 0.1 - 0.9 x10E3/uL   EOS (ABSOLUTE) 0.1 0.0 - 0.4 x10E3/uL   Basophils Absolute 0.0 0.0 - 0.2 x10E3/uL   Immature Granulocytes 0 Not Estab. %   Immature Grans (Abs) 0.0 0.0 - 0.1 x10E3/uL  Thyroid Panel With TSH  Result Value Ref Range   TSH 1.530 0.450 - 4.500 uIU/mL   T4, Total 7.4 4.5 - 12.0 ug/dL   T3 Uptake Ratio 26 24 - 39 %   Free Thyroxine Index 1.9 1.2 - 4.9  Lipid panel  Result Value Ref Range   Cholesterol, Total 150 100 - 199 mg/dL   Triglycerides 90 0 - 149 mg/dL   HDL 59 >39 mg/dL   VLDL Cholesterol Cal 17 5 - 40 mg/dL   LDL Chol Calc (NIH) 74 0 - 99 mg/dL   Chol/HDL Ratio 2.5 0.0 - 4.4 ratio  HIV Antibody (routine testing w rflx)  Result Value Ref Range   HIV Screen 4th Generation wRfx Non Reactive Non Reactive  Hepatitis C antibody  Result Value Ref Range   Hep C Virus Ab <0.1 0.0 - 0.9 s/co ratio       Pertinent labs & imaging results that were available during my care of the patient were reviewed by me and considered in my medical decision making.  Assessment & Plan:  Katherine Pearson was seen today for gynecologic exam and annual exam.  Diagnoses and all orders for this visit:  Annual physical exam Went over health maintenance. Diet and exercise encouraged. Discussed healthy living and importance of preventative measures. -     CMP14+EGFR -     CBC with Differential/Platelet -     Thyroid Panel With TSH -     Cancel: US Soft Tissue Head/Neck; Future -     IGP,  Aptima HPV, rfx 16/18,45 -     Ambulatory referral to Gastroenterology -     MM 3D SCREEN BREAST BILATERAL; Future -     Lipid panel -     HIV Antibody (routine testing w rflx) -     Hepatitis C antibody  Screening for deficiency anemia -     CBC with Differential/Platelet  Screening for lipoid disorders -     Lipid panel  Screening for endocrine, metabolic and immunity disorder -     CMP14+EGFR -     Thyroid Panel With TSH  Cervical cancer screening Small area of yellowish discoloration vs discharge to cervix at approximately 9 o'clock. Cervix friable. Wet prep negative for trich, clue cells, and yeast. Many bacteria present on wet prep so additional testing ordered. Pt denies pain, dyspareunia, CMT, back pain or fever. Awaiting results.  -     IGP, Aptima HPV, rfx 16/18,45  Need for vaccine for DT (diphtheria-tetanus) -     Td : Tetanus/diphtheria >7yo Preservative  free  Screening for colorectal cancer -     Ambulatory referral to Gastroenterology  BMI 31.0-31.9,adult Diet and exercise encouraged. Discussed weight management measures.  -     CMP14+EGFR  Right thyroid nodule Denies increase in size of lesion. No associated symptoms. Will order [place] for follow up evaluation.  -     Thyroid Panel With TSH -     US THYROID; Future  Screening for breast cancer -     MM 3D SCREEN BREAST BILATERAL; Future  Vaginal discharge Urinalysis unremarkable. Wet prep negative for clue cells,  trich, and yeast. Did reveal many bacteria so additional testing ordered. Awaiting results. Pt aware to report any new or worsening symptoms.  -     WET PREP FOR TRICH, YEAST, CLUE -     Chlamydia/Gonococcus/Trichomonas, NAA     Continue all other maintenance medications.  Follow up plan: Return if symptoms worsen or fail to improve.  Continue healthy lifestyle choices, including diet (rich in fruits, vegetables, and lean proteins, and low in salt and simple carbohydrates) and exercise (at  least 30 minutes of moderate physical activity daily).  Educational handout given for health maintenance  The above assessment and management plan was discussed with the patient. The patient verbalized understanding of and has agreed to the management plan. Patient is aware to call the clinic if symptoms persist or worsen. Patient is aware when to return to the clinic for a follow-up visit. Patient educated on when it is appropriate to go to the emergency department.   Monia Pouch, FNP-C [facility] [phone]

## 2019-03-19 LAB — LIPID PANEL
Chol/HDL Ratio: 2.5 ratio (ref 0.0–4.4)
Cholesterol, Total: 150 mg/dL (ref 100–199)
HDL: 59 mg/dL (ref >39)
LDL Chol Calc (NIH): 74 mg/dL (ref 0–99)
Triglycerides: 90 mg/dL (ref 0–149)
VLDL Cholesterol Cal: 17 mg/dL (ref 5–40)

## 2019-03-19 LAB — CBC WITH DIFFERENTIAL/PLATELET
Basophils Absolute: 0 10*3/uL (ref 0.0–0.2)
Basos: 1 %
EOS (ABSOLUTE): 0.1 10*3/uL (ref 0.0–0.4)
Eos: 2 %
Hematocrit: 42.8 % (ref 34.0–46.6)
Hemoglobin: 14.5 g/dL (ref 11.1–15.9)
Immature Grans (Abs): 0 10*3/uL (ref 0.0–0.1)
Immature Granulocytes: 0 %
Lymphocytes Absolute: 1.9 10*3/uL (ref 0.7–3.1)
Lymphs: 37 %
MCH: 31.3 pg (ref 26.6–33.0)
MCHC: 33.9 g/dL (ref 31.5–35.7)
MCV: 92 fL (ref 79–97)
Monocytes Absolute: 0.5 10*3/uL (ref 0.1–0.9)
Monocytes: 9 %
Neutrophils Absolute: 2.6 10*3/uL (ref 1.4–7.0)
Neutrophils: 51 %
Platelets: 234 10*3/uL (ref 150–450)
RBC: 4.64 x10E6/uL (ref 3.77–5.28)
RDW: 12 % (ref 11.7–15.4)
WBC: 5.1 10*3/uL (ref 3.4–10.8)

## 2019-03-19 LAB — HIV ANTIBODY (ROUTINE TESTING W REFLEX): HIV Screen 4th Generation wRfx: NONREACTIVE

## 2019-03-19 LAB — THYROID PANEL WITH TSH
Free Thyroxine Index: 1.9 (ref 1.2–4.9)
T3 Uptake Ratio: 26 % (ref 24–39)
T4, Total: 7.4 ug/dL (ref 4.5–12.0)
TSH: 1.53 u[IU]/mL (ref 0.450–4.500)

## 2019-03-19 LAB — CMP14+EGFR
ALT: 23 IU/L (ref 0–32)
AST: 23 IU/L (ref 0–40)
Albumin/Globulin Ratio: 1.5 (ref 1.2–2.2)
Albumin: 4.4 g/dL (ref 3.8–4.9)
Alkaline Phosphatase: 46 IU/L (ref 39–117)
BUN/Creatinine Ratio: 22 (ref 9–23)
BUN: 19 mg/dL (ref 6–24)
Bilirubin Total: 0.4 mg/dL (ref 0.0–1.2)
CO2: 31 mmol/L — ABNORMAL HIGH (ref 20–29)
Calcium: 10.2 mg/dL (ref 8.7–10.2)
Chloride: 98 mmol/L (ref 96–106)
Creatinine, Ser: 0.87 mg/dL (ref 0.57–1.00)
GFR calc Af Amer: 85 mL/min/{1.73_m2} (ref >59)
GFR calc non Af Amer: 74 mL/min/{1.73_m2} (ref >59)
Globulin, Total: 2.9 g/dL (ref 1.5–4.5)
Glucose: 92 mg/dL (ref 65–99)
Potassium: 4.4 mmol/L (ref 3.5–5.2)
Sodium: 141 mmol/L (ref 134–144)
Total Protein: 7.3 g/dL (ref 6.0–8.5)

## 2019-03-19 LAB — HEPATITIS C ANTIBODY: Hep C Virus Ab: <0.1 s/co ratio (ref 0.0–0.9)

## 2019-03-23 LAB — CHLAMYDIA/GONOCOCCUS/TRICHOMONAS, NAA
Chlamydia by NAA: NEGATIVE
Gonococcus by NAA: NEGATIVE
Trich vag by NAA: NEGATIVE

## 2019-03-24 LAB — IGP, APTIMA HPV, RFX 16/18,45: HPV Aptima: NEGATIVE

## 2019-03-25 ENCOUNTER — Encounter: Payer: Self-pay | Admitting: Gastroenterology

## 2019-03-28 ENCOUNTER — Ambulatory Visit (HOSPITAL_COMMUNITY)
Admission: RE | Admit: 2019-03-28 | Discharge: 2019-03-28 | Disposition: A | Discharge status: Home / Self Care | Payer: BC Managed Care – PPO | Source: Ambulatory Visit | Attending: Family Medicine | Admitting: Family Medicine

## 2019-03-28 ENCOUNTER — Other Ambulatory Visit: Payer: Self-pay

## 2019-03-28 DIAGNOSIS — E041 Nontoxic single thyroid nodule: Secondary | ICD-10-CM | POA: Diagnosis not present

## 2019-04-05 ENCOUNTER — Encounter: Payer: Self-pay | Admitting: *Deleted

## 2019-04-14 ENCOUNTER — Other Ambulatory Visit: Payer: Self-pay

## 2019-04-14 DIAGNOSIS — Z20828 Contact with and (suspected) exposure to other viral communicable diseases: Secondary | ICD-10-CM | POA: Diagnosis not present

## 2019-04-14 DIAGNOSIS — Z20822 Contact with and (suspected) exposure to covid-19: Secondary | ICD-10-CM

## 2019-04-15 ENCOUNTER — Telehealth: Payer: Self-pay | Admitting: General Practice

## 2019-04-15 LAB — NOVEL CORONAVIRUS, NAA: SARS-CoV-2, NAA: NOT DETECTED

## 2019-04-15 NOTE — Telephone Encounter (Signed)
Negative COVID results given. Patient results "NOT Detected." Caller expressed understanding. ° °

## 2019-05-03 ENCOUNTER — Other Ambulatory Visit: Payer: Self-pay

## 2019-05-04 ENCOUNTER — Encounter: Payer: Self-pay | Admitting: Family Medicine

## 2019-05-04 ENCOUNTER — Ambulatory Visit: Payer: BC Managed Care – PPO

## 2019-05-04 ENCOUNTER — Ambulatory Visit: Payer: BC Managed Care – PPO | Admitting: Family Medicine

## 2019-05-04 VITALS — BP 113/79 | HR 64 | Temp 99.0°F | Resp 20 | Ht 64.0 in | Wt 178.0 lb

## 2019-05-04 DIAGNOSIS — Z23 Encounter for immunization: Secondary | ICD-10-CM

## 2019-05-04 DIAGNOSIS — I10 Essential (primary) hypertension: Secondary | ICD-10-CM | POA: Diagnosis not present

## 2019-05-04 DIAGNOSIS — L609 Nail disorder, unspecified: Secondary | ICD-10-CM | POA: Insufficient documentation

## 2019-05-04 DIAGNOSIS — E782 Mixed hyperlipidemia: Secondary | ICD-10-CM | POA: Diagnosis not present

## 2019-05-04 NOTE — Patient Instructions (Signed)
It was a pleasure seeing you today, Katherine Pearson.  Information regarding what we discussed is included in this packet.  Please make an appointment to see me in 3 months.   In a few days you may receive a survey in the mail or online from [employer] regarding your visit with [place] today. Please take a moment to fill this out. Your feedback is very important to our office. It can help [place] better understand your needs as well as improve your experience and satisfaction. Thank you for taking your time to complete it. We care about you.  Because of recent events of COVID-19 ("Coronavirus"), please follow CDC recommendations:   1. Wash your hand frequently 2. Avoid touching your face 3. Stay away from people who are sick 4. If you have symptoms such as fever, cough, shortness of breath then call your healthcare provider for further guidance 5. If you are sick, STAY AT HOME, unless otherwise directed by your healthcare provider. 6. Follow directions from state and national officials regarding staying safe    Please feel free to call our office if any questions or concerns arise.  Warm Regards, Monia Pouch, FNP-C [facility] [address] [place], [place] [address] [phone]

## 2019-05-04 NOTE — Progress Notes (Signed)
Subjective:  Patient ID: Katherine Pearson, female    DOB: [birth year], 58 y.o.   MRN: [id number]  Patient Care Team: Baruch Gouty, FNP as PCP - General (Family Medicine) Danie Binder, MD as Consulting Physician (Gastroenterology)   Chief Complaint:  Medical Management of Chronic Issues (3 mo ), Hypertension, and Hyperlipidemia   HPI: Katherine Pearson is a 58 y.o. female presenting on 05/04/2019 for Medical Management of Chronic Issues (3 mo ), Hypertension, and Hyperlipidemia   Katherine Pearson is a 58 yo female who presents for a follow up visit for chronic management of HTN and HLD. She has been doing well. She is concerned about a black line on her right middle toe that has been present for about 5 months.   Hypertension The problem is controlled. Pertinent negatives include no blurred vision, chest pain, headaches, malaise/fatigue, orthopnea, palpitations, peripheral edema, PND or shortness of breath. Risk factors for coronary artery disease include dyslipidemia and obesity. Compliance problems include diet.  There is no history of angina, kidney disease, CAD/MI, CVA, heart failure or left ventricular hypertrophy. There is no history of chronic renal disease.  Hyperlipidemia The problem is controlled. Exacerbating diseases include obesity. She has no history of chronic renal disease, diabetes or hypothyroidism. Pertinent negatives include no chest pain, focal sensory loss, focal weakness, leg pain, myalgias or shortness of breath. Compliance problems include adherence to diet.  Risk factors for coronary artery disease include dyslipidemia, obesity and post-menopausal.     Relevant past medical, surgical, family, and social history reviewed and updated as indicated.  Allergies and medications reviewed and updated. Date reviewed: Chart in Epic.   Past Medical History:  Diagnosis Date  . Basal cell carcinoma (BCC) of nasal sidewall   . 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 Heart  murmur   . History of thyroid nodule    benign, left  . Hyperlipidemia   . Hypertension   . Mitral valve prolapse   . Vitamin D deficiency     Past Surgical History:  Procedure Laterality Date  . BASAL CELL CARCINOMA EXCISION      Social History   Socioeconomic History  . Marital status: Married    Spouse name: Gwyndolyn Saxon  . Number of children: 2  . Years of education: Not on file  . Highest education level: Not on file  Occupational History  . Not on file  Social Needs  . Financial resource strain: Not on file  . Food insecurity    Worry: Not on file    Inability: Not on file  . Transportation needs    Medical: Not on file    Non-medical: Not on file  Tobacco Use  . Smoking status: Never Smoker  . Smokeless tobacco: Never Used  Substance and Sexual Activity  . Alcohol use: Yes    Comment: social  . Drug use: Never  . Sexual activity: Not on file  Lifestyle  . Physical activity    Days per week: Not on file    Minutes per session: Not on file  . Stress: Not on file  Relationships  . Social Herbalist on phone: Not on file    Gets together: Not on file    Attends religious service: Not on file    Active member of club or organization: Not on file    Attends meetings of clubs or organizations: Not on file    Relationship status: Not on file  .  Intimate partner violence    Fear of current or ex partner: Not on file    Emotionally abused: Not on file    Physically abused: Not on file    Forced sexual activity: Not on file  Other Topics Concern  . Not on file  Social History Narrative  . Not on file    Outpatient Encounter Medications as of 05/04/2019  Medication Sig  . atorvastatin (LIPITOR) 20 MG tablet Take 1 tablet (20 mg total) by mouth daily.  . Bilberry 100 MG CAPS Take by mouth.  . Calcium Carb-Cholecalciferol (CALCIUM 600 + D PO) Take by mouth. One a day - D is 800  . lisinopril-hydrochlorothiazide (ZESTORETIC) 20-25 MG tablet Take 1  tablet by mouth daily.  . Multiple Vitamins-Minerals (MULTIVITAMIN ADULT PO) multivitamin  . vitamin C (ASCORBIC ACID) 500 MG tablet Take 500 mg by mouth daily.   No facility-administered encounter medications on file as of 05/04/2019.     Allergies  Allergen Reactions  . Cefuroxime Axetil Rash    Review of Systems  Constitutional: Negative for fatigue, fever, malaise/fatigue and unexpected weight change.  HENT: Negative for trouble swallowing.   Eyes: Negative for blurred vision and visual disturbance.  Respiratory: Negative for shortness of breath.   Cardiovascular: Negative for chest pain, palpitations, orthopnea, leg swelling and PND.  Gastrointestinal: Negative for abdominal pain, constipation, diarrhea, nausea and vomiting.  Musculoskeletal: Negative for myalgias.  Skin: Negative for color change.  Neurological: Negative for dizziness, focal weakness, weakness, light-headedness, numbness and headaches.  Psychiatric/Behavioral: Negative for dysphoric mood.        Objective:  BP 113/79   Pulse 64   Temp 99 F (37.2 C)   Resp 20   Ht 5\' 4"  (1.626 m)   Wt 178 lb (80.7 kg)   SpO2 98%   BMI 30.55 kg/m    Wt Readings from Last 3 Encounters:  05/04/19 178 lb (80.7 kg)  03/18/19 180 lb (81.6 kg)  02/01/19 182 lb (82.6 kg)    Physical Exam Vitals signs and nursing note reviewed.  Constitutional:      General: She is not in acute distress.    Appearance: Normal appearance. She is not ill-appearing or toxic-appearing.  HENT:     Head: Normocephalic and atraumatic.  Eyes:     Extraocular Movements: Extraocular movements intact.     Pupils: Pupils are equal, round, and reactive to light.  Neck:     Musculoskeletal: Normal range of motion and neck supple. No muscular tenderness.     Thyroid: No thyroid tenderness.     Vascular: No carotid bruit.     Comments: Small solid nodule to mid right thyroid, nontender.  Cardiovascular:     Rate and Rhythm: Normal rate and  regular rhythm.     Heart sounds: Normal heart sounds. No murmur.  Pulmonary:     Effort: Pulmonary effort is normal. No respiratory distress.     Breath sounds: Normal breath sounds.  Abdominal:     General: Bowel sounds are normal.     Palpations: Abdomen is soft.     Tenderness: There is no abdominal tenderness.  Musculoskeletal: Normal range of motion.     Right lower leg: No edema.     Left lower leg: No edema.  Lymphadenopathy:     Cervical: No cervical adenopathy.  Skin:    General: Skin is warm and dry.     Capillary Refill: Capillary refill takes less than 2 seconds.  Comments: Bilaterally great toenails thick and discolored. Linear black line present to right 3rd toe.   Neurological:     Mental Status: She is alert and oriented to person, place, and time. Mental status is at baseline.     Gait: Gait normal.  Psychiatric:        Mood and Affect: Mood normal.        Behavior: Behavior normal.        Thought Content: Thought content normal.        Judgment: Judgment normal.     Results for orders placed or performed in visit on 04/14/19  Novel Coronavirus, NAA ([facility])   Specimen: Oropharyngeal(OP) collection in vial transport medium   OROPHARYNGEA  TESTING  Result Value Ref Range   SARS-CoV-2, NAA Not Detected Not Detected       Pertinent labs & imaging results that were available during my care of the patient were reviewed by me and considered in my medical decision making.  Assessment & Plan:  Nancye was seen today for medical management of chronic issues, hypertension and hyperlipidemia.  Diagnoses and all orders for this visit:  Essential hypertension BP well controlled. Changes were not made in regimen today. Goal BP is 130/80. Pt aware to report any persistent high or low readings. DASH diet and exercise encouraged. Exercise at least 150 minutes per week and increase as tolerated. Goal BMI < 25. Stress management encouraged. Avoid nicotine and tobacco  product use. Avoid excessive alcohol and NSAID's. Avoid more than 2000 mg of sodium daily. Medications as prescribed. Follow up as scheduled. Labwork last month was unremarkable.   Mixed hyperlipidemia Diet encouraged - increase intake of fresh fruits and vegetables, increase intake of lean proteins. Bake, broil, or grill foods. Avoid fried, greasy, and fatty foods. Avoid fast foods. Increase intake of fiber-rich whole grains. Exercise encouraged - at least 150 minutes per week and advance as tolerated.  Goal BMI < 25. Continue medications as prescribed. Follow up in 3-6 months as discussed. Labs last month were unremarkable.    Nail abnormality Concern for fungal infection of both great toes. Will refer to podiatry for further evaluation of splinter hemorrhage and treatment of possible fungal infection.    Influenza vaccine given today.   Continue all other maintenance medications.  Follow up plan: Return in about 3 months (around 08/04/2019), or if symptoms worsen or fail to improve, for PAP.  Continue healthy lifestyle choices, including diet (rich in fruits, vegetables, and lean proteins, and low in salt and simple carbohydrates) and exercise (at least 30 minutes of moderate physical activity daily).  Educational handout given for health maintenance.   The above assessment and management plan was discussed with the patient. The patient verbalized understanding of and has agreed to the management plan. Patient is aware to call the clinic if they develop any new symptoms or if symptoms persist or worsen. Patient is aware when to return to the clinic for a follow-up visit. Patient educated on when it is appropriate to go to the emergency department.   Marjorie Smolder, RN [facility] [phone]  I personally was present during the history, physical exam, and medical decision-making activities of this service and have verified that the service and findings are  accurately documented in the nurse practitioner student's note.  Monia Pouch, FNP-C [facility] [address] [place], [place] [address] [phone]

## 2019-05-05 ENCOUNTER — Other Ambulatory Visit: Payer: Self-pay

## 2019-05-05 ENCOUNTER — Ambulatory Visit (INDEPENDENT_AMBULATORY_CARE_PROVIDER_SITE_OTHER): Payer: Self-pay | Admitting: *Deleted

## 2019-05-05 DIAGNOSIS — Z1211 Encounter for screening for malignant neoplasm of colon: Secondary | ICD-10-CM

## 2019-05-05 MED ORDER — PEG 3350-KCL-NA BICARB-NACL 420 G PO SOLR: 4000.0000 mL | Freq: Once | ORAL | 0 refills | Status: AC

## 2019-05-05 NOTE — Patient Instructions (Signed)
Katherine Pearson   19-Feb-1961 MRN: [id number]    Procedure Date: 07/29/2019 Time to register: 9:30 am Place to register: [facility] Procedure Time: 10:30 am Scheduled provider: Dr. Gala Romney  PREPARATION FOR COLONOSCOPY WITH TRI-LYTE SPLIT PREP  Please notify us immediately if you are diabetic, take iron supplements, or if you are on Coumadin or any other blood thinners.   You will need to purchase 1 fleet enema and 1 box of Bisacodyl '5mg'$  tablets.   2 DAYS BEFORE PROCEDURE:  DATE: 07/27/2019   DAY: Wednesday Begin clear liquid diet AFTER your lunch meal. NO SOLID FOODS after this point.  1 DAY BEFORE PROCEDURE:  DATE: 07/28/2019   DAY: Thursday Continue clear liquids the entire day - NO SOLID FOOD.    At 2:00 pm:  Take 2 Bisacodyl tablets.   At 4:00pm:  Start drinking your solution. Make sure you mix well per instructions on the bottle. Try to drink 1 (one) 8 ounce glass every 10-15 minutes until you have consumed HALF the jug. You should complete by 6:00pm.You must keep the left over solution refrigerated until completed next day.  Continue clear liquids. You must drink plenty of clear liquids to prevent dehyration and kidney failure.     DAY OF PROCEDURE:   DATE: 07/29/2019   DAY: Friday If you take medications for your heart, blood pressure or breathing, you may take these medications.   Five hours before your procedure time @ 5:30 am:  Finish remaining amout of bowel prep, drinking 1 (one) 8 ounce glass every 10-15 minutes until complete. You have two hours to consume remaining prep.   Three hours before your procedure time @ 7:30 am:  Nothing by mouth.   At least one hour before going to the hospital:  Give yourself one Fleet enema. You may take your morning medications with sip of water unless we have instructed otherwise.      Please see below for Dietary Information.  CLEAR LIQUIDS INCLUDE:  Water Jello (NOT red in color)   Ice Popsicles (NOT red in color)    Tea (sugar ok, no milk/cream) Powdered fruit flavored drinks  Coffee (sugar ok, no milk/cream) Gatorade/ Lemonade/ Kool-Aid  (NOT red in color)   Juice: apple, white grape, white cranberry Soft drinks  Clear bullion, consomme, broth (fat free beef/chicken/vegetable)  Carbonated beverages (any kind)  Strained chicken noodle soup Hard Candy   Remember: Clear liquids are liquids that will allow you to see your fingers on the other side of a clear glass. Be sure liquids are NOT red in color, and not cloudy, but CLEAR.  DO NOT EAT OR DRINK ANY OF THE FOLLOWING:  Dairy products of any kind   Cranberry juice Tomato juice / V8 juice   Grapefruit juice Orange juice     Red grape juice  Do not eat any solid foods, including such foods as: cereal, oatmeal, yogurt, fruits, vegetables, creamed soups, eggs, bread, crackers, pureed foods in a blender, etc.   HELPFUL HINTS FOR DRINKING PREP SOLUTION:   Make sure prep is extremely cold. Mix and refrigerate the the morning of the prep. You may also put in the freezer.   You may try mixing some Crystal Light or Country Time Lemonade if you prefer. Mix in small amounts; add more if necessary.  Try drinking through a straw  Rinse mouth with water or a mouthwash between glasses, to remove after-taste.  Try sipping on a cold beverage /ice/ popsicles between glasses  of prep.  Place a piece of sugar-free hard candy in mouth between glasses.  If you become nauseated, try consuming smaller amounts, or stretch out the time between glasses. Stop for 30-60 minutes, then slowly start back drinking.        OTHER INSTRUCTIONS  You will need a responsible adult at least 58 years of age to accompany you and drive you home. This person must remain in the waiting room during your procedure. The hospital will cancel your procedure if you do not have a responsible adult with you.   1. Wear loose fitting clothing that is easily removed. 2. Leave jewelry and  other valuables at home.  3. Remove all body piercing jewelry and leave at home. 4. Total time from sign-in until discharge is approximately 2-3 hours. 5. You should go home directly after your procedure and rest. You can resume normal activities the day after your procedure. 6. The day of your procedure you should not:  Drive  Make legal decisions  Operate machinery  Drink alcohol  Return to work   You may call the office (Dept: [phone]) before 5:00pm, or page the doctor on call [phone]) after 5:00pm, for further instructions, if necessary.   Insurance Information YOU WILL NEED TO CHECK WITH YOUR INSURANCE COMPANY FOR THE BENEFITS OF COVERAGE YOU HAVE FOR THIS PROCEDURE.  UNFORTUNATELY, NOT ALL INSURANCE COMPANIES HAVE BENEFITS TO COVER ALL OR PART OF THESE TYPES OF PROCEDURES.  IT IS YOUR RESPONSIBILITY TO CHECK YOUR BENEFITS, HOWEVER, WE WILL BE GLAD TO ASSIST YOU WITH ANY CODES YOUR INSURANCE COMPANY MAY NEED.    PLEASE NOTE THAT MOST INSURANCE COMPANIES WILL NOT COVER A SCREENING COLONOSCOPY FOR PEOPLE UNDER THE AGE OF 50  IF YOU HAVE BCBS INSURANCE, YOU MAY HAVE BENEFITS FOR A SCREENING COLONOSCOPY BUT IF POLYPS ARE FOUND THE DIAGNOSIS WILL CHANGE AND THEN YOU MAY HAVE A DEDUCTIBLE THAT WILL NEED TO BE MET. SO PLEASE MAKE SURE YOU CHECK YOUR BENEFITS FOR A SCREENING COLONOSCOPY AS WELL AS A DIAGNOSTIC COLONOSCOPY.

## 2019-05-05 NOTE — Progress Notes (Addendum)
Gastroenterology Pre-Procedure Review  Request Date: 05/05/2019 Requesting Physician: Darla Lesches, NP @ [facility], Last TCS done 5 years ago in [place], no polyps per pt  PATIENT REVIEW QUESTIONS: The patient responded to the following health history questions as indicated:    1. Diabetes Melitis: no 2. Joint replacements in the past 12 months: no 3. Major health problems in the past 3 months: no 4. Has an artificial valve or MVP: no 5. Has a defibrillator: no 6. Has been advised in past to take antibiotics in advance of a procedure like teeth cleaning: no 7. Family history of colon cancer: no  8. Alcohol Use: yes, 1 glass of wine in 3 months 9. Illicit drug Use: no 10. History of sleep apnea: no  11. History of coronary artery or other vascular stents placed within the last 12 months: no 12. History of any prior anesthesia complications: yes, hard to wake 6 years ago but has been fine since then 13. There is no height or weight on file to calculate BMI. ht: 5'4 wt: 183 lbs    MEDICATIONS & ALLERGIES:    Patient reports the following regarding taking any blood thinners:   Plavix? no Aspirin? no Coumadin? no Brilinta? no Xarelto? no Eliquis? no Pradaxa? no Savaysa? no Effient? no  Patient confirms/reports the following medications:  Current Outpatient Medications  Medication Sig Dispense Refill  . atorvastatin (LIPITOR) 20 MG tablet Take 1 tablet (20 mg total) by mouth daily. 90 tablet 1  . Bilberry 100 MG CAPS Take by mouth.    . Calcium Carb-Cholecalciferol (CALCIUM 600 + D PO) Take by mouth. One a day - D is 800    . lisinopril-hydrochlorothiazide (ZESTORETIC) 20-25 MG tablet Take 1 tablet by mouth daily. 90 tablet 1  . Multiple Vitamins-Minerals (MULTIVITAMIN ADULT PO) daily.     . vitamin C (ASCORBIC ACID) 500 MG tablet Take 500 mg by mouth daily.     No current facility-administered medications for this visit.     Patient confirms/reports the following allergies:   Allergies  Allergen Reactions  . Cefuroxime Axetil Rash    No orders of the defined types were placed in this encounter.   AUTHORIZATION INFORMATION Primary Insurance: [employer] of [place],  [place] #:[id number] ,  Group #: [id number] Pre-Cert / Josem Kaufmann required: No, not required per [facility] / Auth #: [id number]  SCHEDULE INFORMATION: Procedure has been scheduled as follows:  Date: 07/29/2019, Time: 10:30 Location: [facility] with Dr. Gala Romney  This Gastroenterology Pre-Precedure Review Form is being routed to the following provider(s): Aliene Altes, PA

## 2019-05-06 NOTE — Progress Notes (Signed)
We do not have enough information on the need for scheduling TCS within 5 years of last TCS with no polyps reported. Could we get last TCS report to review? Does she have any family history of colon cancer or colon polyps?

## 2019-05-09 ENCOUNTER — Telehealth: Payer: Self-pay | Admitting: *Deleted

## 2019-05-09 NOTE — Telephone Encounter (Signed)
Spoke with pt and she is going to bring her last TCS report by for [place] to copy so [facility] can review.

## 2019-05-10 DIAGNOSIS — Z1231 Encounter for screening mammogram for malignant neoplasm of breast: Secondary | ICD-10-CM | POA: Diagnosis not present

## 2019-05-17 ENCOUNTER — Encounter: Payer: Self-pay | Admitting: Family Medicine

## 2019-05-19 ENCOUNTER — Encounter: Payer: Self-pay | Admitting: Family Medicine

## 2019-05-24 DIAGNOSIS — M79676 Pain in unspecified toe(s): Secondary | ICD-10-CM | POA: Diagnosis not present

## 2019-05-24 DIAGNOSIS — B351 Tinea unguium: Secondary | ICD-10-CM | POA: Diagnosis not present

## 2019-06-02 NOTE — Telephone Encounter (Signed)
Error

## 2019-06-03 NOTE — Progress Notes (Signed)
Can we reach out to patient again? I do not think we have received any records from her.

## 2019-06-16 DIAGNOSIS — H2513 Age-related nuclear cataract, bilateral: Secondary | ICD-10-CM | POA: Diagnosis not present

## 2019-06-16 DIAGNOSIS — H16143 Punctate keratitis, bilateral: Secondary | ICD-10-CM | POA: Diagnosis not present

## 2019-06-16 DIAGNOSIS — H50011 Monocular esotropia, right eye: Secondary | ICD-10-CM | POA: Diagnosis not present

## 2019-06-20 ENCOUNTER — Telehealth: Payer: Self-pay | Admitting: Gastroenterology

## 2019-06-20 NOTE — Telephone Encounter (Signed)
Received and reviewed colonoscopy report from [place].  Colonoscopy dated 08/19/2013.  Performed under propofol.  Indication: Screening for colorectal malignant neoplasm. Findings: External and internal hemorrhoids were found during retroflexion and were small.  The exam was otherwise without abnormality. Recommendations: Repeat colonoscopy in 10 years for screening purposes.  Patient was initially triaged for colonoscopy on 05/05/2019.  I have requested records as patient denied history of colon polyps or family history of colon cancer.  As patient did not have any colon polyps on last colonoscopy with recommendations to repeat in 10 years, I do not recommend early interval colonoscopy unless she has bright red blood per rectum or unintentional weight loss.  If she develops any concerning GI symptoms prior to the timing of her next colonoscopy (2025), we would be glad to see her in office.  Will have report scanned into patients chart.   See Triage notes.  Angie FYI

## 2019-06-20 NOTE — Telephone Encounter (Signed)
Lmom for pt to call me back. 

## 2019-06-20 NOTE — Progress Notes (Signed)
Received and reviewed colonoscopy records from [place].  Colonoscopy on 08/19/2013 without polyps.  Recommendations to repeat colonoscopy in 10 years for screening purposes.  As long as patient is without any alarm symptoms including bright red blood per rectum or unintentional weight loss, I would not recommend early interval colonoscopy at this time.  If she develops any symptoms prior to the due date of her next colonoscopy, we would be happy to see her in the office.

## 2019-06-21 NOTE — Telephone Encounter (Signed)
Called pt and advised her of [facility]'s recommendations.  Pt said that Darla Lesches, NP at [facility] and her [facility] therapist recommended her to have a 5 year repeat TCS.  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

## 2019-06-21 NOTE — Telephone Encounter (Signed)
Lmom for pt to call us back. 

## 2019-06-21 NOTE — Telephone Encounter (Signed)
Called [employer] and Judson Roch (customer service rep) informed me that pt's policy termed on [id number].  Called pt and LMOM for her to call me back.

## 2019-06-21 NOTE — Progress Notes (Signed)
OK to schedule TCS.   See separate phone note dated 06/20/19. Pt said that Darla Lesches, NP at [facility] and her [facility] therapist recommended her to have a 5 year repeat TCS.  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

## 2019-06-21 NOTE — Telephone Encounter (Signed)
Lmom for pt to call back. 

## 2019-06-21 NOTE — Telephone Encounter (Signed)
Noted. In this case, we can proceed with scheduling TCS.

## 2019-06-22 DIAGNOSIS — H16143 Punctate keratitis, bilateral: Secondary | ICD-10-CM | POA: Diagnosis not present

## 2019-06-22 NOTE — Addendum Note (Signed)
Addended by: Metro Kung on: 06/22/2019 03:16 PM   Modules accepted: Orders, SmartSet

## 2019-06-22 NOTE — Telephone Encounter (Signed)
Pt called back and said that she was unaware that her policy number had changed.  Pt gave me the new ID number.  I called [employer] and no authorization is required.  REF# [id number]

## 2019-07-25 ENCOUNTER — Telehealth: Payer: Self-pay | Admitting: Gastroenterology

## 2019-07-25 MED ORDER — PEG 3350-KCL-NA BICARB-NACL 420 G PO SOLR: 4000.0000 mL | Freq: Once | ORAL | 0 refills | Status: AC

## 2019-07-25 NOTE — Telephone Encounter (Signed)
Sent RX to [facility] per pt request.  Routing to [facility] since she originally signed off on triage.

## 2019-07-25 NOTE — Telephone Encounter (Signed)
PATIENT CALLED AND SAID TO PLEASE SEND HER PREP TO [facility]

## 2019-07-25 NOTE — Addendum Note (Signed)
Addended by: Metro Kung on: 07/25/2019 04:37 PM   Modules accepted: Orders

## 2019-07-25 NOTE — Telephone Encounter (Signed)
Called pt back and left a vm for a return call.

## 2019-07-25 NOTE — Telephone Encounter (Signed)
Noted.  Orders were cosigned.

## 2019-07-27 ENCOUNTER — Other Ambulatory Visit (HOSPITAL_COMMUNITY)
Admission: RE | Admit: 2019-07-27 | Discharge: 2019-07-27 | Disposition: A | Discharge status: Home / Self Care | Payer: BC Managed Care – PPO | Source: Ambulatory Visit | Attending: Internal Medicine | Admitting: Internal Medicine

## 2019-07-27 ENCOUNTER — Other Ambulatory Visit: Payer: Self-pay

## 2019-07-27 DIAGNOSIS — Z20822 Contact with and (suspected) exposure to covid-19: Secondary | ICD-10-CM | POA: Diagnosis not present

## 2019-07-27 DIAGNOSIS — Z01812 Encounter for preprocedural laboratory examination: Secondary | ICD-10-CM | POA: Insufficient documentation

## 2019-07-27 LAB — SARS CORONAVIRUS 2 (TAT 6-24 HRS): SARS Coronavirus 2: NEGATIVE

## 2019-07-28 ENCOUNTER — Other Ambulatory Visit: Payer: Self-pay | Admitting: *Deleted

## 2019-07-28 DIAGNOSIS — E782 Mixed hyperlipidemia: Secondary | ICD-10-CM

## 2019-07-28 MED ORDER — ATORVASTATIN CALCIUM 20 MG PO TABS: 20.0000 mg | ORAL_TABLET | Freq: Every day | ORAL | 0 refills | Status: DC

## 2019-07-29 ENCOUNTER — Encounter (HOSPITAL_COMMUNITY)
Admission: RE | Disposition: A | Discharge status: Home / Self Care | Payer: Self-pay | Source: Home / Self Care | Attending: Internal Medicine

## 2019-07-29 ENCOUNTER — Encounter (HOSPITAL_COMMUNITY): Payer: Self-pay | Admitting: Internal Medicine

## 2019-07-29 ENCOUNTER — Other Ambulatory Visit: Payer: Self-pay

## 2019-07-29 ENCOUNTER — Ambulatory Visit (HOSPITAL_COMMUNITY)
Admission: RE | Admit: 2019-07-29 | Discharge: 2019-07-29 | Disposition: A | Discharge status: Home / Self Care | Payer: BC Managed Care – PPO | Attending: Internal Medicine | Admitting: Internal Medicine

## 2019-07-29 DIAGNOSIS — I341 Nonrheumatic mitral (valve) prolapse: Secondary | ICD-10-CM | POA: Diagnosis not present

## 2019-07-29 DIAGNOSIS — I10 Essential (primary) hypertension: Secondary | ICD-10-CM | POA: Diagnosis not present

## 2019-07-29 DIAGNOSIS — Z85828 Personal history of other malignant neoplasm of skin: Secondary | ICD-10-CM | POA: Insufficient documentation

## 2019-07-29 DIAGNOSIS — D127 Benign neoplasm of rectosigmoid junction: Secondary | ICD-10-CM | POA: Diagnosis not present

## 2019-07-29 DIAGNOSIS — E785 Hyperlipidemia, unspecified: Secondary | ICD-10-CM | POA: Insufficient documentation

## 2019-07-29 DIAGNOSIS — Z79899 Other long term (current) drug therapy: Secondary | ICD-10-CM | POA: Insufficient documentation

## 2019-07-29 DIAGNOSIS — K640 First degree hemorrhoids: Secondary | ICD-10-CM | POA: Diagnosis not present

## 2019-07-29 DIAGNOSIS — Z1509 Genetic susceptibility to other malignant neoplasm: Secondary | ICD-10-CM | POA: Insufficient documentation

## 2019-07-29 DIAGNOSIS — K635 Polyp of colon: Secondary | ICD-10-CM

## 2019-07-29 DIAGNOSIS — Z1211 Encounter for screening for malignant neoplasm of colon: Secondary | ICD-10-CM | POA: Insufficient documentation

## 2019-07-29 HISTORY — PX: COLONOSCOPY: SHX5424

## 2019-07-29 HISTORY — PX: POLYPECTOMY: SHX5525

## 2019-07-29 SURGERY — COLONOSCOPY
Anesthesia: Moderate Sedation

## 2019-07-29 MED ORDER — MEPERIDINE HCL 50 MG/ML IJ SOLN
INTRAMUSCULAR | Status: AC
  Filled 2019-07-29: qty 1

## 2019-07-29 MED ORDER — ONDANSETRON HCL 4 MG/2ML IJ SOLN
INTRAMUSCULAR | Status: DC | PRN
  Administered 2019-07-29: 4 mg via INTRAVENOUS

## 2019-07-29 MED ORDER — MIDAZOLAM HCL 5 MG/5ML IJ SOLN
INTRAMUSCULAR | Status: AC
  Filled 2019-07-29: qty 10

## 2019-07-29 MED ORDER — MIDAZOLAM HCL 5 MG/5ML IJ SOLN
INTRAMUSCULAR | Status: DC | PRN
  Administered 2019-07-29 (×2): 1 mg via INTRAVENOUS
  Administered 2019-07-29 (×2): 2 mg via INTRAVENOUS
  Administered 2019-07-29 (×2): 1 mg via INTRAVENOUS

## 2019-07-29 MED ORDER — STERILE WATER FOR IRRIGATION IR SOLN
Status: DC | PRN
  Administered 2019-07-29: 1.5 mL

## 2019-07-29 MED ORDER — ONDANSETRON HCL 4 MG/2ML IJ SOLN
INTRAMUSCULAR | Status: AC
  Filled 2019-07-29: qty 2

## 2019-07-29 MED ORDER — MEPERIDINE HCL 100 MG/ML IJ SOLN
INTRAMUSCULAR | Status: DC | PRN
  Administered 2019-07-29: 10 mg via INTRAVENOUS
  Administered 2019-07-29: 25 mg via INTRAVENOUS
  Administered 2019-07-29: 15 mg via INTRAVENOUS

## 2019-07-29 MED ORDER — SODIUM CHLORIDE 0.9 % IV SOLN
INTRAVENOUS | Status: DC
  Administered 2019-07-29: 1000 mL via INTRAVENOUS

## 2019-07-29 NOTE — Discharge Instructions (Signed)
Colonoscopy Discharge Instructions  Read the instructions outlined below and refer to this sheet in the next few weeks. These discharge instructions provide you with general information on caring for yourself after you leave the hospital. Your doctor may also give you specific instructions. While your treatment has been planned according to the most current medical practices available, unavoidable complications occasionally occur. If you have any problems or questions after discharge, call Dr. Gala Romney at [phone]. ACTIVITY  You may resume your regular activity, but move at a slower pace for the next 24 hours.   Take frequent rest periods for the next 24 hours.   Walking will help get rid of the air and reduce the bloated feeling in your belly (abdomen).   No driving for 24 hours (because of the medicine (anesthesia) used during the test).    Do not sign any important legal documents or operate any machinery for 24 hours (because of the anesthesia used during the test).  NUTRITION  Drink plenty of fluids.   You may resume your normal diet as instructed by your doctor.   Begin with a light meal and progress to your normal diet. Heavy or fried foods are harder to digest and may make you feel sick to your stomach (nauseated).   Avoid alcoholic beverages for 24 hours or as instructed.  MEDICATIONS  You may resume your normal medications unless your doctor tells you otherwise.  WHAT YOU CAN EXPECT TODAY  Some feelings of bloating in the abdomen.   Passage of more gas than usual.   Spotting of blood in your stool or on the toilet paper.  IF YOU HAD POLYPS REMOVED DURING THE COLONOSCOPY:  No aspirin products for 7 days or as instructed.   No alcohol for 7 days or as instructed.   Eat a soft diet for the next 24 hours.  FINDING OUT THE RESULTS OF YOUR TEST Not all test results are available during your visit. If your test results are not back during the visit, make an appointment  with your caregiver to find out the results. Do not assume everything is normal if you have not heard from your caregiver or the medical facility. It is important for you to follow up on all of your test results.  SEEK IMMEDIATE MEDICAL ATTENTION IF:  You have more than a spotting of blood in your stool.   Your belly is swollen (abdominal distention).   You are nauseated or vomiting.   You have a temperature over 101.   You have abdominal pain or discomfort that is severe or gets worse throughout the day.    Colon Polyps  Polyps are tissue growths inside the body. Polyps can grow in many places, including the large intestine (colon). A polyp may be a round bump or a mushroom-shaped growth. You could have one polyp or several. Most colon polyps are noncancerous (benign). However, some colon polyps can become cancerous over time. Finding and removing the polyps early can help prevent this. What are the causes? The exact cause of colon polyps is not known. What increases the risk? You are more likely to develop this condition if you:  Have a family history of colon cancer or colon polyps.  Are older than 67 or older than 45 if you are African American.  Have inflammatory bowel disease, such as ulcerative colitis or Crohn's disease.  Have certain hereditary conditions, such as: ? Familial adenomatous polyposis. ? Lynch syndrome. ? Turcot syndrome. ? Peutz-Jeghers syndrome.  Are  overweight.  Smoke cigarettes.  Do not get enough exercise.  Drink too much alcohol.  Eat a diet that is high in fat and red meat and low in fiber.  Had childhood cancer that was treated with abdominal radiation. What are the signs or symptoms? Most polyps do not cause symptoms. If you have symptoms, they may include:  Blood coming from your rectum when having a bowel movement.  Blood in your stool. The stool may look dark red or black.  Abdominal pain.  A change in bowel habits, such as  constipation or diarrhea. How is this diagnosed? This condition is diagnosed with a colonoscopy. This is a procedure in which a lighted, flexible scope is inserted into the anus and then passed into the colon to examine the area. Polyps are sometimes found when a colonoscopy is done as part of routine cancer screening tests. How is this treated? Treatment for this condition involves removing any polyps that are found. Most polyps can be removed during a colonoscopy. Those polyps will then be tested for cancer. Additional treatment may be needed depending on the results of testing. Follow these instructions at home: Lifestyle  Maintain a healthy weight, or lose weight if recommended by your health care provider.  Exercise every day or as told by your health care provider.  Do not use any products that contain nicotine or tobacco, such as cigarettes and e-cigarettes. If you need help quitting, ask your health care provider.  If you drink alcohol, limit how much you have: ? 0-1 drink a day for women. ? 0-2 drinks a day for men.  Be aware of how much alcohol is in your drink. In the [place] one drink equals one 12 oz bottle of beer (355 mL), one 5 oz glass of wine (148 mL), or one 1 oz shot of hard liquor (44 mL). Eating and drinking   Eat foods that are high in fiber, such as fruits, vegetables, and whole grains.  Eat foods that are high in calcium and vitamin D, such as milk, cheese, yogurt, eggs, liver, fish, and broccoli.  Limit foods that are high in fat, such as fried foods and desserts.  Limit the amount of red meat and processed meat you eat, such as hot dogs, sausage, bacon, and lunch meats. General instructions  Keep all follow-up visits as told by your health care provider. This is important. ? This includes having regularly scheduled colonoscopies. ? Talk to your health care provider about when you need a colonoscopy. Contact a health care provider if:  You have new or  worsening bleeding during a bowel movement.  You have new or increased blood in your stool.  You have a change in bowel habits.  You lose weight for no known reason. Summary  Polyps are tissue growths inside the body. Polyps can grow in many places, including the colon.  Most colon polyps are noncancerous (benign), but some can become cancerous over time.  This condition is diagnosed with a colonoscopy.  Treatment for this condition involves removing any polyps that are found. Most polyps can be removed during a colonoscopy. This information is not intended to replace advice given to you by your health care provider. Make sure you discuss any questions you have with your health care provider. Document Revised: 10/15/2017 Document Reviewed: 10/15/2017 Elsevier Patient Education  [place].    Colon polyp information provided  Further recommendations to follow pending review of pathology report  At patient request, called Bill,  [phone] and reviewed results

## 2019-07-29 NOTE — Op Note (Signed)
[facility] Patient Name: Katherine Pearson Procedure Date: 07/29/2019 9:45 AM MRN: [id number] Date of Birth: May 13, 1961 Attending MD: Norvel Richards , MD CSN: [id number] Age: 59 Admit Type: Outpatient Procedure:                Colonoscopy Indications:              Screening for colorectal malignant neoplasm Providers:                Norvel Richards, MD, Charlsie Quest. Theda Sers RN, RN,                            Aram Candela Referring MD:              Medicines:                Midazolam 8 mg IV, Meperidine 50 mg IV, Ondansetron                            4 mg IV Complications:            No immediate complications. Estimated Blood Loss:     Estimated blood loss was minimal. Procedure:                Pre-Anesthesia Assessment:                           - Prior to the procedure, a History and Physical                            was performed, and patient medications and                            allergies were reviewed. The patient's tolerance of                            previous anesthesia was also reviewed. The risks                            and benefits of the procedure and the sedation                            options and risks were discussed with the patient.                            All questions were answered, and informed consent                            was obtained. Prior Anticoagulants: The patient has                            taken no previous anticoagulant or antiplatelet                            agents. ASA Grade Assessment: II - A patient with  mild systemic disease. After reviewing the risks                            and benefits, the patient was deemed in                            satisfactory condition to undergo the procedure.                           After obtaining informed consent, the colonoscope                            was passed under direct vision. Throughout the                            procedure, the  patient's blood pressure, pulse, and                            oxygen saturations were monitored continuously. The                            [id number] scope was introduced through                            the anus and advanced to the the cecum, identified                            by appendiceal orifice and ileocecal valve. The                            entire colon was well visualized. The ileocecal                            valve, appendiceal orifice, and rectum were                            photographed. The colonoscopy was performed without                            difficulty. The patient tolerated the procedure                            well. The quality of the bowel preparation was                            adequate. Scope In: 10:38:24 AM Scope Out: 10:57:33 AM Scope Withdrawal Time: 0 hours 8 minutes 9 seconds  Total Procedure Duration: 0 hours 19 minutes 9 seconds  Findings:      The perianal and digital rectal examinations were normal.      Non-bleeding internal hemorrhoids were found during retroflexion. The       hemorrhoids were moderate, medium-sized and Grade I (internal       hemorrhoids that do not prolapse).      A 6 mm polyp was found in the  recto-sigmoid colon. The polyp was       semi-pedunculated. The polyp was removed with a cold snare. Resection       and retrieval were complete. Estimated blood loss was minimal.      The exam was otherwise without abnormality on direct and retroflexion       views. Impression:               - Non-bleeding internal hemorrhoids.                           - One 6 mm polyp at the recto-sigmoid colon,                            removed with a cold snare. Resected and retrieved.                           - The examination was otherwise normal on direct                            and retroflexion views. Moderate Sedation:      Moderate (conscious) sedation was administered by the endoscopy nurse       and  supervised by the endoscopist. The following parameters were       monitored: oxygen saturation, heart rate, blood pressure, respiratory       rate, EKG, adequacy of pulmonary ventilation, and response to care.       Total physician intraservice time was 28 minutes. Recommendation:           - Written discharge instructions were provided to                            the patient.                           - The signs and symptoms of potential delayed                            complications were discussed with the patient.                           - Patient has a contact number available for                            emergencies.                           - Return to normal activities tomorrow.                           - Advance diet as tolerated. Follow-up on pathology                           - Return to my office (date not yet determined). Procedure Code(s):        --- Professional ---  [phone], Colonoscopy, flexible; with removal of                            tumor(s), polyp(s), or other lesion(s) by snare                            technique                           99153, Moderate sedation; each additional 15                            minutes intraservice time                           G0500, Moderate sedation services provided by the                            same physician or other qualified health care                            professional performing a gastrointestinal                            endoscopic service that sedation supports,                            requiring the presence of an independent trained                            observer to assist in the monitoring of the                            patient's level of consciousness and physiological                            status; initial 15 minutes of intra-service time;                            patient age 1 years or older (additional time may                            be reported  with [phone], as appropriate) Diagnosis Code(s):        --- Professional ---                           Z12.11, Encounter for screening for malignant                            neoplasm of colon                           K63.5, Polyp of colon                           K64.0, First degree hemorrhoids  CPT copyright 2019 American Medical Association. All rights reserved. The codes documented in this report are preliminary and upon coder review may  be revised to meet current compliance requirements. Cristopher Estimable. Trust Crago, MD Norvel Richards, MD 07/29/2019 11:05:42 AM This report has been signed electronically. Number of Addenda: 0

## 2019-07-29 NOTE — H&P (Signed)
$'@LOGO'A$ @   Primary Care Physician:  Baruch Gouty, FNP Primary Gastroenterologist:  Dr. Gala Romney  Pre-Procedure History & Physical: HPI:  Katherine Pearson is a 59 y.o. female here for screening colonoscopy.  Negative colonoscopy in [place] previously.  No bowel symptoms.  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 Basal cell carcinoma (BCC) of nasal sidewall   . 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 Heart murmur   . History of thyroid nodule    benign, left  . Hyperlipidemia   . Hypertension   . Mitral valve prolapse   . Vitamin D deficiency     Past Surgical History:  Procedure Laterality Date  . BASAL CELL CARCINOMA EXCISION      Prior to Admission medications   Medication Sig Start Date End Date Taking? Authorizing Provider  acetaminophen (TYLENOL) 500 MG tablet Take 500-1,000 mg by mouth every 6 (six) hours as needed (for pain/headaches.).   Yes [provider]  Bilberry 100 MG CAPS Take 100 mg by mouth daily.  11/16/13  Yes [provider]  Calcium Carb-Cholecalciferol (CALCIUM 600+D) 600-800 MG-UNIT TABS Take 1 tablet by mouth daily.   Yes [provider]  lisinopril-hydrochlorothiazide (ZESTORETIC) 20-25 MG tablet Take 1 tablet by mouth daily. 02/01/19 07/26/20 Yes Rakes, Connye Burkitt, FNP  Multiple Vitamin (MULTIVITAMIN WITH MINERALS) TABS tablet Take 1 tablet by mouth daily.   Yes [provider]  vitamin C (ASCORBIC ACID) 500 MG tablet Take 500 mg by mouth every 14 (fourteen) days. Chewable   Yes [provider]  atorvastatin (LIPITOR) 20 MG tablet Take 1 tablet (20 mg total) by mouth daily. 07/28/19 10/26/19  Baruch Gouty, FNP    Allergies as of 06/22/2019 - Review Complete 05/05/2019  Allergen Reaction Noted  . Cefuroxime axetil Rash 09/26/2010    Family History  Problem Relation Age of Onset  . Breast cancer Mother   . Depression Mother   . Hypertension Mother   . Cancer Mother         breast  . Drug abuse Brother   . Arthritis Maternal Grandmother   . Hypertension Maternal Grandmother   . Gout Maternal Grandmother   . Heart disease Maternal Grandmother   . Diabetes Maternal Grandfather   . Heart disease Maternal Grandfather   . Obesity Maternal Grandfather   . Aneurysm Paternal Grandmother   . Heart murmur Father   . Heart disease Paternal Grandfather   . Heart defect Son     Social History   Socioeconomic History  . Marital status: Married    Spouse name: Gwyndolyn Saxon  . Number of children: 2  . Years of education: Not on file  . Highest education level: Not on file  Occupational History  . Not on file  Tobacco Use  . Smoking status: Never Smoker  . Smokeless tobacco: Never Used  Substance and Sexual Activity  . Alcohol use: Yes    Comment: social  . Drug use: Never  . Sexual activity: Not on file  Other Topics Concern  . Not on file  Social History Narrative  . Not on file   Social Determinants of Health   Financial Resource Strain:   . Difficulty of Paying Living Expenses: Not on file  Food Insecurity:   . Worried About Charity fundraiser in the Last Year: Not on file  . Ran Out of Food in the Last Year: Not on file  Transportation Needs:   . Film/video editor (Medical): Not on file  . Lack of Transportation (Non-Medical): Not on file  Physical Activity:   . Days of Exercise per Week: Not on file  . Minutes of Exercise per Session: Not on file  Stress:   . Feeling of Stress : Not on file  Social Connections:   . Frequency of Communication with Friends and Family: Not on file  . Frequency of Social Gatherings with Friends and Family: Not on file  . Attends Religious Services: Not on file  . Active Member of Clubs or Organizations: Not on file  . Attends Archivist Meetings: Not on file  . Marital Status: Not on file  Intimate Partner Violence:   . Fear of Current or Ex-Partner: Not on file  . Emotionally Abused: Not  on file  . Physically Abused: Not on file  . Sexually Abused: Not on file    Review of Systems: See HPI, otherwise negative ROS  Physical Exam: BP 126/76   Pulse 92   Temp 98.3 F (36.8 C) (Oral)   Resp 17   Ht '5\' 4"'$  (1.626 m)   Wt 79.4 kg   SpO2 97%   BMI 30.04 kg/m  General:   Alert,  Well-developed, well-nourished, pleasant and cooperative in NAD Neck:  Supple; no masses or thyromegaly. No significant cervical adenopathy. Lungs:  Clear throughout to auscultation.   No wheezes, crackles, or rhonchi. No acute distress. Heart:  Regular rate and rhythm; no murmurs, clicks, rubs,  or gallops. Abdomen: Non-distended, normal bowel sounds.  Soft and nontender without appreciable mass or hepatosplenomegaly.  Pulses:  Normal pulses noted. Extremities:  Without clubbing or edema.  Impression/Plan: 59 year old lady presents for colorectal cancer screening via colonoscopy.  Reported gene mutation.  Further information needs to be gathered regarding this finding. Colonoscopy today.  The risks, benefits, limitations, alternatives and imponderables have been reviewed with the patient. Questions have been answered. All parties are agreeable.      Notice: This dictation was prepared with Dragon dictation along with smaller phrase technology. Any transcriptional errors that result from this process are unintentional and may not be corrected upon review.

## 2019-08-01 ENCOUNTER — Encounter: Payer: Self-pay | Admitting: Internal Medicine

## 2019-08-01 LAB — SURGICAL PATHOLOGY

## 2019-08-03 ENCOUNTER — Other Ambulatory Visit: Payer: Self-pay | Admitting: Family Medicine

## 2019-08-03 DIAGNOSIS — E782 Mixed hyperlipidemia: Secondary | ICD-10-CM

## 2019-08-03 MED ORDER — ATORVASTATIN CALCIUM 20 MG PO TABS: 20.0000 mg | ORAL_TABLET | Freq: Every day | ORAL | 0 refills | Status: DC

## 2019-08-10 ENCOUNTER — Encounter: Payer: BC Managed Care – PPO | Admitting: Family Medicine

## 2019-08-10 ENCOUNTER — Other Ambulatory Visit: Payer: Self-pay

## 2019-08-18 DIAGNOSIS — M79676 Pain in unspecified toe(s): Secondary | ICD-10-CM | POA: Diagnosis not present

## 2019-08-18 DIAGNOSIS — B351 Tinea unguium: Secondary | ICD-10-CM | POA: Diagnosis not present

## 2019-08-30 ENCOUNTER — Telehealth: Payer: Self-pay | Admitting: Family Medicine

## 2019-08-30 ENCOUNTER — Other Ambulatory Visit: Payer: Self-pay

## 2019-08-31 ENCOUNTER — Other Ambulatory Visit: Payer: Self-pay

## 2019-08-31 ENCOUNTER — Encounter: Payer: Self-pay | Admitting: Family Medicine

## 2019-10-07 ENCOUNTER — Other Ambulatory Visit: Payer: Self-pay

## 2019-10-07 ENCOUNTER — Ambulatory Visit (INDEPENDENT_AMBULATORY_CARE_PROVIDER_SITE_OTHER): Payer: BC Managed Care – PPO | Admitting: Family Medicine

## 2019-10-07 ENCOUNTER — Encounter: Payer: Self-pay | Admitting: Family Medicine

## 2019-10-07 ENCOUNTER — Ambulatory Visit (INDEPENDENT_AMBULATORY_CARE_PROVIDER_SITE_OTHER): Payer: BC Managed Care – PPO

## 2019-10-07 VITALS — BP 121/76 | HR 71 | Temp 99.0°F | Resp 20 | Ht 64.0 in | Wt 183.0 lb

## 2019-10-07 DIAGNOSIS — Z6831 Body mass index (BMI) 31.0-31.9, adult: Secondary | ICD-10-CM | POA: Diagnosis not present

## 2019-10-07 DIAGNOSIS — I10 Essential (primary) hypertension: Secondary | ICD-10-CM | POA: Diagnosis not present

## 2019-10-07 DIAGNOSIS — Z780 Asymptomatic menopausal state: Secondary | ICD-10-CM

## 2019-10-07 DIAGNOSIS — R87615 Unsatisfactory cytologic smear of cervix: Secondary | ICD-10-CM | POA: Diagnosis not present

## 2019-10-07 DIAGNOSIS — Z8639 Personal history of other endocrine, nutritional and metabolic disease: Secondary | ICD-10-CM | POA: Diagnosis not present

## 2019-10-07 DIAGNOSIS — E559 Vitamin D deficiency, unspecified: Secondary | ICD-10-CM

## 2019-10-07 DIAGNOSIS — B351 Tinea unguium: Secondary | ICD-10-CM

## 2019-10-07 DIAGNOSIS — E782 Mixed hyperlipidemia: Secondary | ICD-10-CM

## 2019-10-07 MED ORDER — ATORVASTATIN CALCIUM 20 MG PO TABS: 20.0000 mg | ORAL_TABLET | Freq: Every day | ORAL | 0 refills | Status: DC

## 2019-10-07 MED ORDER — LISINOPRIL-HYDROCHLOROTHIAZIDE 20-25 MG PO TABS: 1.0000 | ORAL_TABLET | Freq: Every day | ORAL | 1 refills | Status: DC

## 2019-10-07 MED ORDER — TERBINAFINE HCL 250 MG PO TABS: 250.0000 mg | ORAL_TABLET | Freq: Every day | ORAL | 0 refills | Status: DC

## 2019-10-07 NOTE — Patient Instructions (Signed)
Health Maintenance, Female Adopting a healthy lifestyle and getting preventive care are important in promoting health and wellness. Ask your health care provider about:  The right schedule for you to have regular tests and exams.  Things you can do on your own to prevent diseases and keep yourself healthy. What should I know about diet, weight, and exercise? Eat a healthy diet   Eat a diet that includes plenty of vegetables, fruits, low-fat dairy products, and lean protein.  Do not eat a lot of foods that are high in solid fats, added sugars, or sodium. Maintain a healthy weight Body mass index (BMI) is used to identify weight problems. It estimates body fat based on height and weight. Your health care provider can help determine your BMI and help you achieve or maintain a healthy weight. Get regular exercise Get regular exercise. This is one of the most important things you can do for your health. Most adults should:  Exercise for at least 150 minutes each week. The exercise should increase your heart rate and make you sweat (moderate-intensity exercise).  Do strengthening exercises at least twice a week. This is in addition to the moderate-intensity exercise.  Spend less time sitting. Even light physical activity can be beneficial. Watch cholesterol and blood lipids Have your blood tested for lipids and cholesterol at 59 years of age, then have this test every 5 years. Have your cholesterol levels checked more often if:  Your lipid or cholesterol levels are high.  You are older than 59 years of age.  You are at high risk for heart disease. What should I know about cancer screening? Depending on your health history and family history, you may need to have cancer screening at various ages. This may include screening for:  Breast cancer.  Cervical cancer.  Colorectal cancer.  Skin cancer.  Lung cancer. What should I know about heart disease, diabetes, and high blood  pressure? Blood pressure and heart disease  High blood pressure causes heart disease and increases the risk of stroke. This is more likely to develop in people who have high blood pressure readings, are of African descent, or are overweight.  Have your blood pressure checked: ? Every 3-5 years if you are 18-39 years of age. ? Every year if you are 40 years old or older. Diabetes Have regular diabetes screenings. This checks your fasting blood sugar level. Have the screening done:  Once every three years after age 40 if you are at a normal weight and have a low risk for diabetes.  More often and at a younger age if you are overweight or have a high risk for diabetes. What should I know about preventing infection? Hepatitis B If you have a higher risk for hepatitis B, you should be screened for this virus. Talk with your health care provider to find out if you are at risk for hepatitis B infection. Hepatitis C Testing is recommended for:  Everyone born from 1945 through 1965.  Anyone with known risk factors for hepatitis C. Sexually transmitted infections (STIs)  Get screened for STIs, including gonorrhea and chlamydia, if: ? You are sexually active and are younger than 59 years of age. ? You are older than 59 years of age and your health care provider tells you that you are at risk for this type of infection. ? Your sexual activity has changed since you were last screened, and you are at increased risk for chlamydia or gonorrhea. Ask your health care provider if   you are at risk.  Ask your health care provider about whether you are at high risk for HIV. Your health care provider may recommend a prescription medicine to help prevent HIV infection. If you choose to take medicine to prevent HIV, you should first get tested for HIV. You should then be tested every 3 months for as long as you are taking the medicine. Pregnancy  If you are about to stop having your period (premenopausal) and  you may become pregnant, seek counseling before you get pregnant.  Take 400 to 800 micrograms (mcg) of folic acid every day if you become pregnant.  Ask for birth control (contraception) if you want to prevent pregnancy. Osteoporosis and menopause Osteoporosis is a disease in which the bones lose minerals and strength with aging. This can result in bone fractures. If you are 65 years old or older, or if you are at risk for osteoporosis and fractures, ask your health care provider if you should:  Be screened for bone loss.  Take a calcium or vitamin D supplement to lower your risk of fractures.  Be given hormone replacement therapy (HRT) to treat symptoms of menopause. Follow these instructions at home: Lifestyle  Do not use any products that contain nicotine or tobacco, such as cigarettes, e-cigarettes, and chewing tobacco. If you need help quitting, ask your health care provider.  Do not use street drugs.  Do not share needles.  Ask your health care provider for help if you need support or information about quitting drugs. Alcohol use  Do not drink alcohol if: ? Your health care provider tells you not to drink. ? You are pregnant, may be pregnant, or are planning to become pregnant.  If you drink alcohol: ? Limit how much you use to 0-1 drink a day. ? Limit intake if you are breastfeeding.  Be aware of how much alcohol is in your drink. In the [place] one drink equals one 12 oz bottle of beer (355 mL), one 5 oz glass of wine (148 mL), or one 1 oz glass of hard liquor (44 mL). General instructions  Schedule regular health, dental, and eye exams.  Stay current with your vaccines.  Tell your health care provider if: ? You often feel depressed. ? You have ever been abused or do not feel safe at home. Summary  Adopting a healthy lifestyle and getting preventive care are important in promoting health and wellness.  Follow your health care provider's instructions about healthy  diet, exercising, and getting tested or screened for diseases.  Follow your health care provider's instructions on monitoring your cholesterol and blood pressure. This information is not intended to replace advice given to you by your health care provider. Make sure you discuss any questions you have with your health care provider. Document Revised: 06/23/2018 Document Reviewed: 06/23/2018 Elsevier Patient Education  2020 Elsevier Inc.  

## 2019-10-07 NOTE — Progress Notes (Signed)
Subjective:  Patient ID: Katherine Pearson, female    DOB: 1961/01/13, 59 y.o.   MRN: [id number]  Patient Care Team: Baruch Gouty, FNP as PCP - General (Family Medicine) Danie Binder, MD as Consulting Physician (Gastroenterology)   Chief Complaint:  Gynecologic Exam (repeat pap)   HPI: Katherine Pearson is a 59 y.o. female presenting on 10/07/2019 for Gynecologic Exam (repeat pap)   1. Mixed hyperlipidemia Compliant with statin therapy without associated side effects. Does try to watch diet.   2. Essential hypertension Is taking blood pressure medications as prescribed. No headaches, chest pain, shortness of breath, leg swelling, palpitations, weakness, or confusion.   3. Vitamin D deficiency Pt is taking oral repletion therapy. Denies bone pain and tenderness, muscle weakness, fracture, and difficulty walking.  4. History of thyroid nodule Recent US indicated no need for continued surveillance as the nodules are very small and not concerning for malignancy. Pt asymptomatic.   5. BMI 31.0-31.9,adult Does try to watch diet and stay active on a regular basis. Holds a Facilities manager office job.    Relevant past medical, surgical, family, and social history reviewed and updated as indicated.  Allergies and medications reviewed and updated. Date reviewed: Chart in Epic.   Past Medical History:  Diagnosis Date  . Basal cell carcinoma (BCC) of nasal sidewall   . 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 Heart murmur   . History of thyroid nodule    benign, left  . Hyperlipidemia   . Hypertension   . Mitral valve prolapse   . Vitamin D deficiency     Past Surgical History:  Procedure Laterality Date  . BASAL CELL CARCINOMA EXCISION    . COLONOSCOPY N/A 07/29/2019   Procedure: COLONOSCOPY;  Surgeon: Daneil Dolin, MD;  Location[facility];  Service: Endoscopy;  Laterality: N/A;  10:30  . POLYPECTOMY  07/29/2019   Procedure: POLYPECTOMY;  Surgeon: Daneil Dolin, MD;   Location: [facility];  Service: Endoscopy;;  recto-sigmoid    Social History   Socioeconomic History  . Marital status: Married    Spouse name: Gwyndolyn Saxon  . Number of children: 2  . Years of education: Not on file  . Highest education level: Not on file  Occupational History  . Not on file  Tobacco Use  . Smoking status: Never Smoker  . Smokeless tobacco: Never Used  Substance and Sexual Activity  . Alcohol use: Yes    Comment: social  . Drug use: Never  . Sexual activity: Not on file  Other Topics Concern  . Not on file  Social History Narrative  . Not on file   Social Determinants of Health   Financial Resource Strain:   . Difficulty of Paying Living Expenses:   Food Insecurity:   . Worried About Charity fundraiser in the Last Year:   . Arboriculturist in the Last Year:   Transportation Needs:   . Film/video editor (Medical):   Marland Kitchen Lack of Transportation (Non-Medical):   Physical Activity:   . Days of Exercise per Week:   . Minutes of Exercise per Session:   Stress:   . Feeling of Stress :   Social Connections:   . Frequency of Communication with Friends and Family:   . Frequency of Social Gatherings with Friends and Family:   . Attends Religious Services:   . Active Member of Clubs or Organizations:   . Attends  Club or Organization Meetings:   Marland Kitchen Marital Status:   Intimate Partner Violence:   . Fear of Current or Ex-Partner:   . Emotionally Abused:   Marland Kitchen Physically Abused:   . Sexually Abused:     Outpatient Encounter Medications as of 10/07/2019  Medication Sig  . acetaminophen (TYLENOL) 500 MG tablet Take 500-1,000 mg by mouth every 6 (six) hours as needed (for pain/headaches.).  Marland Kitchen atorvastatin (LIPITOR) 20 MG tablet Take 1 tablet (20 mg total) by mouth daily.  . Bilberry 100 MG CAPS Take 100 mg by mouth daily.   . Calcium Carb-Cholecalciferol (CALCIUM 600+D) 600-800 MG-UNIT TABS Take 1 tablet by mouth daily.  Marland Kitchen lisinopril-hydrochlorothiazide  (ZESTORETIC) 20-25 MG tablet Take 1 tablet by mouth daily.  . Multiple Vitamin (MULTIVITAMIN WITH MINERALS) TABS tablet Take 1 tablet by mouth daily.  . vitamin C (ASCORBIC ACID) 500 MG tablet Take 500 mg by mouth every 14 (fourteen) days. Chewable  . [DISCONTINUED] atorvastatin (LIPITOR) 20 MG tablet Take 1 tablet (20 mg total) by mouth daily.  . [DISCONTINUED] lisinopril-hydrochlorothiazide (ZESTORETIC) 20-25 MG tablet Take 1 tablet by mouth daily.  Marland Kitchen terbinafine (LAMISIL) 250 MG tablet Take 1 tablet (250 mg total) by mouth daily.   No facility-administered encounter medications on file as of 10/07/2019.    Allergies  Allergen Reactions  . Cefuroxime Axetil Rash    Review of Systems  Constitutional: Negative for activity change, appetite change, chills, diaphoresis, fatigue, fever and unexpected weight change.  HENT: Negative.   Eyes: Negative.  Negative for photophobia and visual disturbance.  Respiratory: Negative for cough, chest tightness and shortness of breath.   Cardiovascular: Negative for chest pain, palpitations and leg swelling.  Gastrointestinal: Negative for abdominal pain, blood in stool, constipation, diarrhea, nausea and vomiting.  Endocrine: Negative.  Negative for cold intolerance, heat intolerance, polydipsia, polyphagia and polyuria.  Genitourinary: Negative for decreased urine volume, difficulty urinating, dysuria, frequency, urgency, vaginal bleeding, vaginal discharge and vaginal pain.  Musculoskeletal: Negative for arthralgias and myalgias.  Skin: Positive for color change (toenails).  Allergic/Immunologic: Negative.   Neurological: Negative for dizziness, tremors, seizures, syncope, facial asymmetry, speech difficulty, weakness, light-headedness, numbness and headaches.  Hematological: Negative.   Psychiatric/Behavioral: Negative for confusion, hallucinations, sleep disturbance and suicidal ideas.  All other systems reviewed and are negative.        Objective:  BP 121/76   Pulse 71   Temp 99 F (37.2 C)   Resp 20   Ht '5\' 4"'$  (1.626 m)   Wt 183 lb (83 kg)   SpO2 98%   BMI 31.41 kg/m    Wt Readings from Last 3 Encounters:  10/07/19 183 lb (83 kg)  07/29/19 175 lb (79.4 kg)  05/04/19 178 lb (80.7 kg)    Physical Exam Vitals and nursing note reviewed.  Constitutional:      General: She is not in acute distress.    Appearance: Normal appearance. She is well-developed and well-groomed. She is obese. She is not ill-appearing, toxic-appearing or diaphoretic.  HENT:     Head: Normocephalic and atraumatic.     Jaw: There is normal jaw occlusion.     Right Ear: Hearing normal.     Left Ear: Hearing normal.     Nose: Nose normal.     Mouth/Throat:     Lips: Pink.     Mouth: Mucous membranes are moist.     Pharynx: Oropharynx is clear. Uvula midline.  Eyes:     General: Lids are normal.  Extraocular Movements: Extraocular movements intact.     Conjunctiva/sclera: Conjunctivae normal.     Pupils: Pupils are equal, round, and reactive to light.  Neck:     Thyroid: No thyroid mass, thyromegaly or thyroid tenderness.     Vascular: No carotid bruit or JVD.     Trachea: Trachea and phonation normal.  Cardiovascular:     Rate and Rhythm: Normal rate and regular rhythm.     Chest Wall: PMI is not displaced.     Pulses: Normal pulses.     Heart sounds: Normal heart sounds. No murmur. No friction rub. No gallop.   Pulmonary:     Effort: Pulmonary effort is normal. No respiratory distress.     Breath sounds: Normal breath sounds. No wheezing.  Abdominal:     General: Bowel sounds are normal. There is no distension or abdominal bruit.     Palpations: Abdomen is soft. There is no hepatomegaly or splenomegaly.     Tenderness: There is no abdominal tenderness. There is no right CVA tenderness or left CVA tenderness.     Hernia: No hernia is present. There is no hernia in the left inguinal area or right inguinal area.   Genitourinary:    General: Normal vulva.     Exam position: Lithotomy position.     Pubic Area: No rash or pubic lice.      Tanner stage (genital): 5.     Labia:        Right: No rash, tenderness, lesion or injury.        Left: No rash, tenderness, lesion or injury.      Urethra: No prolapse, urethral pain, urethral swelling or urethral lesion.     Vagina: Normal.     Cervix: Normal.     Uterus: Normal.      Adnexa: Right adnexa normal and left adnexa normal.  Musculoskeletal:        General: Normal range of motion.     Cervical back: Normal range of motion and neck supple.     Right lower leg: No edema.     Left lower leg: No edema.  Feet:     Right foot:     Toenail Condition: Fungal disease present.    Left foot:     Toenail Condition: Fungal disease present. Lymphadenopathy:     Cervical: No cervical adenopathy.     Lower Body: No right inguinal adenopathy. No left inguinal adenopathy.  Skin:    General: Skin is warm and dry.     Capillary Refill: Capillary refill takes less than 2 seconds.     Coloration: Skin is not cyanotic, jaundiced or pale.     Findings: No rash.  Neurological:     General: No focal deficit present.     Mental Status: She is alert and oriented to person, place, and time.     Cranial Nerves: Cranial nerves are intact. No cranial nerve deficit.     Sensory: Sensation is intact. No sensory deficit.     Motor: Motor function is intact. No weakness.     Coordination: Coordination is intact. Coordination normal.     Gait: Gait is intact. Gait normal.     Deep Tendon Reflexes: Reflexes are normal and symmetric. Reflexes normal.  Psychiatric:        Attention and Perception: Attention and perception normal.        Mood and Affect: Mood and affect normal.        Speech: Speech normal.  Behavior: Behavior normal. Behavior is cooperative.        Thought Content: Thought content normal.        Cognition and Memory: Cognition and memory normal.         Judgment: Judgment normal.     Results for orders placed or performed during the hospital encounter of 07/29/19  Surgical pathology  Result Value Ref Range   SURGICAL PATHOLOGY      SURGICAL PATHOLOGY CASE: [id number] PATIENT: Jayni Clarida Surgical Pathology Report     Clinical History: screening   FINAL MICROSCOPIC DIAGNOSIS:  A. COLON, RECTOSIGMOID, POLYPECTOMY: -  Benign mesenchymal polyp -  No high-grade dysplasia or malignancy identified -  See comment  COMMENT:  Dr. Vic Ripper reviewed the case and agrees with the above diagnosis.  GROSS DESCRIPTION:  Received in formalin is a 0.25 cm tan-pink polyp, in toto one block.  SW 07/30/2019    Final Diagnosis performed by Thressa Sheller, MD.   Electronically signed 08/01/2019 Technical component performed at [facility], [place] [address] [place], [place] [address].  Professional component performed at [employer]. [facility], [place] [address], [place], [place] [address].  Immunohistochemistry Technical component (if applicable) was performed at [facility]. [address], [place], [place],  [address].   IMMUNOHISTOCHEMISTRY DISCLAIM ER (if applicable): Some of these immunohistochemical stains may have been developed and the performance characteristics determine by [facility]. Some may not have been cleared or approved by the U.S. Food and Drug Administration. The FDA has determined that such clearance or approval is not necessary. This test is used for clinical purposes. It should not be regarded as investigational or for research. This laboratory is certified under the [place] (CLIA-88) as qualified to perform high complexity clinical laboratory testing.  The controls stained appropriately.        Pertinent labs & imaging results that were available during my care of the patient were reviewed by me  and considered in my medical decision making.  Assessment & Plan:  Shamicka was seen today for gynecologic exam.  Diagnoses and all orders for this visit:  Mixed hyperlipidemia Diet encouraged - increase intake of fresh fruits and vegetables, increase intake of lean proteins. Bake, broil, or grill foods. Avoid fried, greasy, and fatty foods. Avoid fast foods. Increase intake of fiber-rich whole grains. Exercise encouraged - at least 150 minutes per week and advance as tolerated.  Goal BMI < 25. Continue medications as prescribed. Follow up in 3-6 months as discussed.  -     Lipid panel -     atorvastatin (LIPITOR) 20 MG tablet; Take 1 tablet (20 mg total) by mouth daily.  Essential hypertension BP well controlled. Changes were not made in regimen today. Goal BP is 130/80. Pt aware to report any persistent high or low readings. DASH diet and exercise encouraged. Exercise at least 150 minutes per week and increase as tolerated. Goal BMI > 25. Stress management encouraged. Avoid nicotine and tobacco product use. Avoid excessive alcohol and NSAID's. Avoid more than 2000 mg of sodium daily. Medications as prescribed. Follow up as scheduled.  -     CMP14+EGFR -     CBC with Differential/Platelet -     Lipid panel -     TSH -     lisinopril-hydrochlorothiazide (ZESTORETIC) 20-25 MG tablet; Take 1 tablet by mouth daily.  Vitamin D deficiency Labs pending. Continue repletion therapy. If indicated, will change repletion dosage.  Eat foods rich in Vit D including milk, orange juice, yogurt with vitamin D added, salmon or mackerel, canned tuna fish, cereals with vitamin D added, and cod liver oil. Get out in the sun but make sure to wear at least SPF 30 sunscreen.  -     VITAMIN D 25 Hydroxy (Vit-D Deficiency, Fractures)  History of thyroid nodule Will check TSH today. Recent thyroid US did not indicate need for surveillance.  -     TSH  BMI 31.0-31.9,adult Diet and exercise encouraged. Labs pending.   -     CMP14+EGFR -     CBC with Differential/Platelet -     Lipid panel -     VITAMIN D 25 Hydroxy (Vit-D Deficiency, Fractures) -     TSH  Encounter for repeat Pap smear due to previous insuff cervical cells Last PAP inconclusive, will repeat today.  -     IGP, Aptima HPV, rfx 16/18,45  Onychomycosis of toenail Seen by Dr. Irving Shows who recommended oral terbinafine therapy. Labs pending today and will repeat CMP at completion of therapy. Pt aware to have repeat CMP in 3 months.  -     terbinafine (LAMISIL) 250 MG tablet; Take 1 tablet (250 mg total) by mouth daily. -     CMP14+EGFR; Future     Continue all other maintenance medications.  Follow up plan: Return in about 6 months (around 04/08/2020), or if symptoms worsen or fail to improve.    Continue healthy lifestyle choices, including diet (rich in fruits, vegetables, and lean proteins, and low in salt and simple carbohydrates) and exercise (at least 30 minutes of moderate physical activity daily).  Educational handout given for health maintenance  The above assessment and management plan was discussed with the patient. The patient verbalized understanding of and has agreed to the management plan. Patient is aware to call the clinic if they develop any new symptoms or if symptoms persist or worsen. Patient is aware when to return to the clinic for a follow-up visit. Patient educated on when it is appropriate to go to the emergency department.   Monia Pouch, FNP-C [facility] [phone]

## 2019-10-08 LAB — CBC WITH DIFFERENTIAL/PLATELET
Basophils Absolute: 0.1 10*3/uL (ref 0.0–0.2)
Basos: 1 %
EOS (ABSOLUTE): 0.1 10*3/uL (ref 0.0–0.4)
Eos: 2 %
Hematocrit: 42.6 % (ref 34.0–46.6)
Hemoglobin: 14.4 g/dL (ref 11.1–15.9)
Immature Grans (Abs): 0 10*3/uL (ref 0.0–0.1)
Immature Granulocytes: 0 %
Lymphocytes Absolute: 2.1 10*3/uL (ref 0.7–3.1)
Lymphs: 37 %
MCH: 30.9 pg (ref 26.6–33.0)
MCHC: 33.8 g/dL (ref 31.5–35.7)
MCV: 91 fL (ref 79–97)
Monocytes Absolute: 0.5 10*3/uL (ref 0.1–0.9)
Monocytes: 9 %
Neutrophils Absolute: 2.9 10*3/uL (ref 1.4–7.0)
Neutrophils: 51 %
Platelets: 221 10*3/uL (ref 150–450)
RBC: 4.66 x10E6/uL (ref 3.77–5.28)
RDW: 12.4 % (ref 11.7–15.4)
WBC: 5.6 10*3/uL (ref 3.4–10.8)

## 2019-10-08 LAB — CMP14+EGFR
ALT: 24 IU/L (ref 0–32)
AST: 23 IU/L (ref 0–40)
Albumin/Globulin Ratio: 1.7 (ref 1.2–2.2)
Albumin: 4.3 g/dL (ref 3.8–4.9)
Alkaline Phosphatase: 55 IU/L (ref 39–117)
BUN/Creatinine Ratio: 25 — ABNORMAL HIGH (ref 9–23)
BUN: 18 mg/dL (ref 6–24)
Bilirubin Total: 0.3 mg/dL (ref 0.0–1.2)
CO2: 28 mmol/L (ref 20–29)
Calcium: 9.7 mg/dL (ref 8.7–10.2)
Chloride: 100 mmol/L (ref 96–106)
Creatinine, Ser: 0.73 mg/dL (ref 0.57–1.00)
GFR calc Af Amer: 105 mL/min/{1.73_m2} (ref >59)
GFR calc non Af Amer: 91 mL/min/{1.73_m2} (ref >59)
Globulin, Total: 2.6 g/dL (ref 1.5–4.5)
Glucose: 116 mg/dL — ABNORMAL HIGH (ref 65–99)
Potassium: 3.7 mmol/L (ref 3.5–5.2)
Sodium: 141 mmol/L (ref 134–144)
Total Protein: 6.9 g/dL (ref 6.0–8.5)

## 2019-10-08 LAB — LIPID PANEL
Chol/HDL Ratio: 2.2 ratio (ref 0.0–4.4)
Cholesterol, Total: 141 mg/dL (ref 100–199)
HDL: 64 mg/dL (ref >39)
LDL Chol Calc (NIH): 61 mg/dL (ref 0–99)
Triglycerides: 83 mg/dL (ref 0–149)
VLDL Cholesterol Cal: 16 mg/dL (ref 5–40)

## 2019-10-08 LAB — VITAMIN D 25 HYDROXY (VIT D DEFICIENCY, FRACTURES): Vit D, 25-Hydroxy: 50.7 ng/mL (ref 30.0–100.0)

## 2019-10-08 LAB — TSH: TSH: 1.05 u[IU]/mL (ref 0.450–4.500)

## 2019-10-11 ENCOUNTER — Telehealth: Payer: Self-pay | Admitting: Family Medicine

## 2019-10-11 LAB — IGP, APTIMA HPV, RFX 16/18,45: HPV Aptima: NEGATIVE

## 2019-10-12 MED ORDER — TERBINAFINE HCL 250 MG PO TABS: 250.0000 mg | ORAL_TABLET | Freq: Every day | ORAL | 0 refills | Status: DC

## 2019-10-12 NOTE — Telephone Encounter (Signed)
That is fine 

## 2019-10-12 NOTE — Telephone Encounter (Signed)
Spoke with [facility] and she states that the treatment for toenail fungus is 1 tablet for 12 weeks which is 84 tablets. Please advise if this is okay to do.

## 2019-10-12 NOTE — Addendum Note (Signed)
Addended by: Baruch Gouty on: 10/12/2019 08:28 AM   Modules accepted: Orders

## 2019-10-12 NOTE — Telephone Encounter (Signed)
It is specified as 90 day therapy and for toenail fungus

## 2019-10-12 NOTE — Telephone Encounter (Signed)
Pharmacy aware

## 2019-10-28 ENCOUNTER — Other Ambulatory Visit (HOSPITAL_COMMUNITY): Payer: BC Managed Care – PPO

## 2019-12-21 ENCOUNTER — Encounter: Payer: Self-pay | Admitting: *Deleted

## 2020-01-11 ENCOUNTER — Encounter: Payer: Self-pay | Admitting: Family Medicine

## 2020-01-11 ENCOUNTER — Ambulatory Visit: Payer: BC Managed Care – PPO | Admitting: Family Medicine

## 2020-01-11 ENCOUNTER — Other Ambulatory Visit: Payer: Self-pay

## 2020-01-11 VITALS — BP 124/77 | HR 65 | Temp 97.5°F | Ht 64.0 in | Wt 182.6 lb

## 2020-01-11 DIAGNOSIS — M7989 Other specified soft tissue disorders: Secondary | ICD-10-CM | POA: Diagnosis not present

## 2020-01-11 DIAGNOSIS — B351 Tinea unguium: Secondary | ICD-10-CM

## 2020-01-11 NOTE — Progress Notes (Signed)
Assessment & Plan:  1. Soft tissue calcification - Offered referral to hand surgeon but patient declined since the area she was concerned about does not bother her.   2. Onychomycosis - Hepatic function panel   Follow up plan: Return as scheduled.  Hendricks Limes, MSN, APRN, FNP-C [facility]  Subjective:   Patient ID: Katherine Pearson, female    DOB: 14-Nov-1960, 59 y.o.   MRN: [id number]  HPI: Katherine Pearson is a 59 y.o. female presenting on 01/11/2020 for Hand Pain (Left hand x 1 month )  Patient reports she does not have pain in her left hand but she has 2 areas that she has noticed, and wants to make sure it is okay.  They do not bother her at all besides the fact that she can see them.  Patient is also curious if she needs to have some lab work done as she has been on terbinafine since April.   Left thumb on inside where thumb moves Wrist  ROS: Negative unless specifically indicated above in HPI.   Relevant past medical history reviewed and updated as indicated.   Allergies and medications reviewed and updated.   Current Outpatient Medications:  .  acetaminophen (TYLENOL) 500 MG tablet, Take 500-1,000 mg by mouth every 6 (six) hours as needed (for pain/headaches.)., Disp: , Rfl:  .  Bilberry 100 MG CAPS, Take 100 mg by mouth daily. , Disp: , Rfl:  .  Calcium Carb-Cholecalciferol (CALCIUM 600+D) 600-800 MG-UNIT TABS, Take 1 tablet by mouth daily., Disp: , Rfl:  .  Multiple Vitamin (MULTIVITAMIN WITH MINERALS) TABS tablet, Take 1 tablet by mouth daily., Disp: , Rfl:  .  terbinafine (LAMISIL) 250 MG tablet, Take 1 tablet (250 mg total) by mouth daily. 90 day therapy., Disp: 90 tablet, Rfl: 0 .  vitamin C (ASCORBIC ACID) 500 MG tablet, Take 500 mg by mouth every 14 (fourteen) days. Chewable, Disp: , Rfl:  .  atorvastatin (LIPITOR) 20 MG tablet, Take 1 tablet (20 mg total) by mouth daily., Disp: 90 tablet, Rfl: 0 .  lisinopril-hydrochlorothiazide  (ZESTORETIC) 20-25 MG tablet, Take 1 tablet by mouth daily., Disp: 90 tablet, Rfl: 1  Allergies  Allergen Reactions  . Cefuroxime Axetil Rash    Objective:   BP 124/77   Pulse 65   Temp (!) 97.5 F (36.4 C) (Temporal)   Ht 5\' 4"  (1.626 m)   Wt 182 lb 9.6 oz (82.8 kg)   SpO2 97%   BMI 31.34 kg/m    Physical Exam Vitals reviewed.  Constitutional:      General: She is not in acute distress.    Appearance: Normal appearance. She is not ill-appearing, toxic-appearing or diaphoretic.  HENT:     Head: Normocephalic and atraumatic.  Eyes:     General: No scleral icterus.       Right eye: No discharge.        Left eye: No discharge.     Conjunctiva/sclera: Conjunctivae normal.  Cardiovascular:     Rate and Rhythm: Normal rate.  Pulmonary:     Effort: Pulmonary effort is normal. No respiratory distress.  Musculoskeletal:        General: Normal range of motion.     Cervical back: Normal range of motion.     Comments: Patient has two hard (calcified) areas on her left hand - one on the inside of the MCP joint of the thumb, the other on the lateral side of the hand just above  the wrist.   Skin:    General: Skin is warm and dry.     Capillary Refill: Capillary refill takes less than 2 seconds.  Neurological:     General: No focal deficit present.     Mental Status: She is alert and oriented to person, place, and time. Mental status is at baseline.  Psychiatric:        Mood and Affect: Mood normal.        Behavior: Behavior normal.        Thought Content: Thought content normal.        Judgment: Judgment normal.

## 2020-01-12 LAB — HEPATIC FUNCTION PANEL
ALT: 19 IU/L (ref 0–32)
AST: 20 IU/L (ref 0–40)
Albumin: 4.6 g/dL (ref 3.8–4.9)
Alkaline Phosphatase: 54 IU/L (ref 48–121)
Bilirubin Total: 0.2 mg/dL (ref 0.0–1.2)
Bilirubin, Direct: 0.08 mg/dL (ref 0.00–0.40)
Total Protein: 7.5 g/dL (ref 6.0–8.5)

## 2020-01-14 ENCOUNTER — Encounter: Payer: Self-pay | Admitting: Family Medicine

## 2020-03-28 ENCOUNTER — Encounter: Payer: BC Managed Care – PPO | Admitting: Family Medicine

## 2020-03-28 DIAGNOSIS — D485 Neoplasm of uncertain behavior of skin: Secondary | ICD-10-CM | POA: Diagnosis not present

## 2020-03-28 DIAGNOSIS — D2262 Melanocytic nevi of left upper limb, including shoulder: Secondary | ICD-10-CM | POA: Diagnosis not present

## 2020-03-28 DIAGNOSIS — Z85828 Personal history of other malignant neoplasm of skin: Secondary | ICD-10-CM | POA: Diagnosis not present

## 2020-03-28 DIAGNOSIS — D225 Melanocytic nevi of trunk: Secondary | ICD-10-CM | POA: Diagnosis not present

## 2020-03-28 DIAGNOSIS — Z1283 Encounter for screening for malignant neoplasm of skin: Secondary | ICD-10-CM | POA: Diagnosis not present

## 2020-03-28 DIAGNOSIS — Z08 Encounter for follow-up examination after completed treatment for malignant neoplasm: Secondary | ICD-10-CM | POA: Diagnosis not present

## 2020-03-29 ENCOUNTER — Other Ambulatory Visit: Payer: Self-pay

## 2020-03-29 ENCOUNTER — Encounter: Payer: Self-pay | Admitting: Family Medicine

## 2020-03-29 ENCOUNTER — Ambulatory Visit (INDEPENDENT_AMBULATORY_CARE_PROVIDER_SITE_OTHER): Payer: BC Managed Care – PPO | Admitting: Family Medicine

## 2020-03-29 VITALS — BP 127/82 | HR 72 | Temp 98.3°F | Ht 64.0 in | Wt 179.0 lb

## 2020-03-29 DIAGNOSIS — Z0001 Encounter for general adult medical examination with abnormal findings: Secondary | ICD-10-CM

## 2020-03-29 DIAGNOSIS — E041 Nontoxic single thyroid nodule: Secondary | ICD-10-CM

## 2020-03-29 DIAGNOSIS — E782 Mixed hyperlipidemia: Secondary | ICD-10-CM | POA: Diagnosis not present

## 2020-03-29 DIAGNOSIS — I10 Essential (primary) hypertension: Secondary | ICD-10-CM

## 2020-03-29 DIAGNOSIS — Z0000 Encounter for general adult medical examination without abnormal findings: Secondary | ICD-10-CM

## 2020-03-29 MED ORDER — ATORVASTATIN CALCIUM 20 MG PO TABS: 20.0000 mg | ORAL_TABLET | Freq: Every day | ORAL | 3 refills | Status: DC

## 2020-03-29 MED ORDER — LISINOPRIL-HYDROCHLOROTHIAZIDE 20-25 MG PO TABS: 1.0000 | ORAL_TABLET | Freq: Every day | ORAL | 3 refills | Status: DC

## 2020-03-29 NOTE — Progress Notes (Signed)
Assessment & Plan:  1. Well adult exam - Preventive health education provided. Patient declined COVID-19, influenza, and shingles vaccines. Mammogram scheduled for 05/11/2020. 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   2. Essential hypertension - Well controlled on current regimen.  - lisinopril-hydrochlorothiazide (ZESTORETIC) 20-25 MG tablet; Take 1 tablet by mouth daily.  Dispense: 90 tablet; Refill: 3 - CMP14+EGFR - Lipid panel  3. Mixed hyperlipidemia - Well controlled on current regimen.  - atorvastatin (LIPITOR) 20 MG tablet; Take 1 tablet (20 mg total) by mouth daily.  Dispense: 90 tablet; Refill: 3 - CMP14+EGFR - Lipid panel  4. Right thyroid nodule - TSH   Follow-up: Return in about 1 year (around 03/29/2021) for annual physical.   Hendricks Limes, MSN, APRN, FNP-C [facility]  Subjective:  Patient ID: Katherine Pearson, female    DOB: 12-20-1960  Age: 59 y.o. MRN: [id number]  Patient Care Team: Loman Brooklyn, FNP as PCP - General (Family Medicine) Danie Binder, MD (Inactive) as Consulting Physician (Gastroenterology)   CC:  Chief Complaint  Patient presents with  . Annual Exam    no pap 10/07/19    HPI NIEVE ROJERO presents for her annual physical.   Marital status: married, Substance use: none Diet: regular, Exercise: none Last eye exam: December 2020 Last dental exam: June 2021 Last colonoscopy: 07/29/2019 Last mammogram: 05/10/2019 Last pap smear: 10/07/2019 Hepatitis C Screening: completed Immunizations: Flu Vaccine: declined Tdap Vaccine: up to date  Shingrix Vaccine: declined COVID-19 Vaccine: declined     Review of Systems  Constitutional: Negative for chills, fever, malaise/fatigue and weight loss.  HENT: Negative for congestion, ear discharge, ear pain, nosebleeds, sinus pain, sore throat and tinnitus.   Eyes: Negative for blurred vision, double vision, pain, discharge  and redness.  Respiratory: Negative for cough, shortness of breath and wheezing.   Cardiovascular: Negative for chest pain, palpitations and leg swelling.  Gastrointestinal: Negative for abdominal pain, constipation, diarrhea, heartburn, nausea and vomiting.  Genitourinary: Negative for dysuria, frequency and urgency.  Musculoskeletal: Negative for myalgias.  Skin: Negative for rash.  Neurological: Negative for dizziness, seizures, weakness and headaches.  Psychiatric/Behavioral: Negative for depression, substance abuse and suicidal ideas. The patient is not nervous/anxious.      Current Outpatient Medications:  .  acetaminophen (TYLENOL) 500 MG tablet, Take 500-1,000 mg by mouth every 6 (six) hours as needed (for pain/headaches.)., Disp: , Rfl:  .  Bilberry 100 MG CAPS, Take 100 mg by mouth daily. , Disp: , Rfl:  .  Calcium Carb-Cholecalciferol (CALCIUM 600+D) 600-800 MG-UNIT TABS, Take 1 tablet by mouth daily., Disp: , Rfl:  .  Multiple Vitamin (MULTIVITAMIN WITH MINERALS) TABS tablet, Take 1 tablet by mouth daily., Disp: , Rfl:  .  vitamin C (ASCORBIC ACID) 500 MG tablet, Take 500 mg by mouth every 14 (fourteen) days. Chewable, Disp: , Rfl:  .  atorvastatin (LIPITOR) 20 MG tablet, Take 1 tablet (20 mg total) by mouth daily., Disp: 90 tablet, Rfl: 0 .  lisinopril-hydrochlorothiazide (ZESTORETIC) 20-25 MG tablet, Take 1 tablet by mouth daily., Disp: 90 tablet, Rfl: 1 .  terbinafine (LAMISIL) 250 MG tablet, Take 1 tablet (250 mg total) by mouth daily. 90 day therapy. (Patient not taking: Reported on 03/29/2020), Disp: 90 tablet, Rfl: 0  Allergies  Allergen Reactions  . Cefuroxime Axetil Rash    Past Medical History:  Diagnosis Date  . Basal cell carcinoma (BCC) of nasal sidewall   .  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 Heart murmur   . History of thyroid nodule    benign, left  . Hyperlipidemia   . Hypertension   . Mitral valve prolapse   . Vitamin D deficiency      Past Surgical History:  Procedure Laterality Date  . BASAL CELL CARCINOMA EXCISION    . COLONOSCOPY N/A 07/29/2019   Procedure: COLONOSCOPY;  Surgeon: Daneil Dolin, MD;  Location: [facility];  Service: Endoscopy;  Laterality: N/A;  10:30  . POLYPECTOMY  07/29/2019   Procedure: POLYPECTOMY;  Surgeon: Daneil Dolin, MD;  Location: [facility];  Service: Endoscopy;;  recto-sigmoid    Family History  Problem Relation Age of Onset  . Breast cancer Mother   . Depression Mother   . Hypertension Mother   . Cancer Mother        breast  . Drug abuse Brother   . Arthritis Maternal Grandmother   . Hypertension Maternal Grandmother   . Gout Maternal Grandmother   . Heart disease Maternal Grandmother   . Diabetes Maternal Grandfather   . Heart disease Maternal Grandfather   . Obesity Maternal Grandfather   . Aneurysm Paternal Grandmother   . Heart murmur Father   . Heart disease Paternal Grandfather   . Heart defect Son     Social History   Socioeconomic History  . Marital status: Married    Spouse name: Gwyndolyn Saxon  . Number of children: 2  . Years of education: Not on file  . Highest education level: Not on file  Occupational History  . Not on file  Tobacco Use  . Smoking status: Never Smoker  . Smokeless tobacco: Never Used  Vaping Use  . Vaping Use: Never used  Substance and Sexual Activity  . Alcohol use: Yes    Comment: social  . Drug use: Never  . Sexual activity: Not on file  Other Topics Concern  . Not on file  Social History Narrative  . Not on file   Social Determinants of Health   Financial Resource Strain:   . Difficulty of Paying Living Expenses: Not on file  Food Insecurity:   . Worried About Charity fundraiser in the Last Year: Not on file  . Ran Out of Food in the Last Year: Not on file  Transportation Needs:   . Lack of Transportation (Medical): Not on file  . Lack of Transportation (Non-Medical): Not on file  Physical Activity:   .  Days of Exercise per Week: Not on file  . Minutes of Exercise per Session: Not on file  Stress:   . Feeling of Stress : Not on file  Social Connections:   . Frequency of Communication with Friends and Family: Not on file  . Frequency of Social Gatherings with Friends and Family: Not on file  . Attends Religious Services: Not on file  . Active Member of Clubs or Organizations: Not on file  . Attends Archivist Meetings: Not on file  . Marital Status: Not on file  Intimate Partner Violence:   . Fear of Current or Ex-Partner: Not on file  . Emotionally Abused: Not on file  . Physically Abused: Not on file  . Sexually Abused: Not on file      Objective:    BP 127/82   Pulse 72   Temp 98.3 F (36.8 C) (Temporal)   Ht $R'5\' 4"'aP$  (1.626 m)   Wt 179 lb (81.2 kg)  BMI 30.73 kg/m   Wt Readings from Last 3 Encounters:  03/29/20 179 lb (81.2 kg)  01/11/20 182 lb 9.6 oz (82.8 kg)  10/07/19 183 lb (83 kg)    Physical Exam Vitals reviewed.  Constitutional:      General: She is not in acute distress.    Appearance: Normal appearance. She is obese. She is not ill-appearing, toxic-appearing or diaphoretic.  HENT:     Head: Normocephalic and atraumatic.     Right Ear: Tympanic membrane, ear canal and external ear normal. There is no impacted cerumen.     Left Ear: Tympanic membrane, ear canal and external ear normal. There is no impacted cerumen.     Nose: Nose normal. No congestion or rhinorrhea.     Mouth/Throat:     Mouth: Mucous membranes are moist.     Pharynx: Oropharynx is clear. No oropharyngeal exudate or posterior oropharyngeal erythema.  Eyes:     General: No scleral icterus.       Right eye: No discharge.        Left eye: No discharge.     Conjunctiva/sclera: Conjunctivae normal.     Pupils: Pupils are equal, round, and reactive to light.  Cardiovascular:     Rate and Rhythm: Normal rate and regular rhythm.     Heart sounds: Normal heart sounds. No murmur  heard.  No friction rub. No gallop.   Pulmonary:     Effort: Pulmonary effort is normal. No respiratory distress.     Breath sounds: Normal breath sounds. No stridor. No wheezing, rhonchi or rales.  Abdominal:     General: Abdomen is flat. Bowel sounds are normal. There is no distension.     Palpations: Abdomen is soft. There is no mass.     Tenderness: There is no abdominal tenderness. There is no guarding or rebound.     Hernia: No hernia is present.  Musculoskeletal:        General: Normal range of motion.     Cervical back: Normal range of motion and neck supple. No rigidity. No muscular tenderness.  Lymphadenopathy:     Cervical: No cervical adenopathy.  Skin:    General: Skin is warm and dry.     Capillary Refill: Capillary refill takes less than 2 seconds.  Neurological:     General: No focal deficit present.     Mental Status: She is alert and oriented to person, place, and time. Mental status is at baseline.  Psychiatric:        Mood and Affect: Mood normal.        Behavior: Behavior normal.        Thought Content: Thought content normal.        Judgment: Judgment normal.     Lab Results  Component Value Date   TSH 1.050 10/07/2019   Lab Results  Component Value Date   WBC 5.6 10/07/2019   HGB 14.4 10/07/2019   HCT 42.6 10/07/2019   MCV 91 10/07/2019   PLT 221 10/07/2019   Lab Results  Component Value Date   NA 141 10/07/2019   K 3.7 10/07/2019   CO2 28 10/07/2019   GLUCOSE 116 (H) 10/07/2019   BUN 18 10/07/2019   CREATININE 0.73 10/07/2019   BILITOT 0.2 01/11/2020   ALKPHOS 54 01/11/2020   AST 20 01/11/2020   ALT 19 01/11/2020   PROT 7.5 01/11/2020   ALBUMIN 4.6 01/11/2020   CALCIUM 9.7 10/07/2019   Lab Results  Component Value Date  CHOL 141 10/07/2019   Lab Results  Component Value Date   HDL 64 10/07/2019   Lab Results  Component Value Date   LDLCALC 61 10/07/2019   Lab Results  Component Value Date   TRIG 83 10/07/2019   Lab  Results  Component Value Date   CHOLHDL 2.2 10/07/2019   No results found for: HGBA1C

## 2020-03-29 NOTE — Patient Instructions (Signed)

## 2020-03-30 ENCOUNTER — Other Ambulatory Visit: Payer: Self-pay | Admitting: Family Medicine

## 2020-03-30 DIAGNOSIS — Z1231 Encounter for screening mammogram for malignant neoplasm of breast: Secondary | ICD-10-CM

## 2020-03-30 LAB — CMP14+EGFR
ALT: 18 IU/L (ref 0–32)
AST: 20 IU/L (ref 0–40)
Albumin/Globulin Ratio: 1.5 (ref 1.2–2.2)
Albumin: 4.3 g/dL (ref 3.8–4.9)
Alkaline Phosphatase: 47 IU/L (ref 44–121)
BUN/Creatinine Ratio: 18 (ref 9–23)
BUN: 15 mg/dL (ref 6–24)
Bilirubin Total: 0.3 mg/dL (ref 0.0–1.2)
CO2: 31 mmol/L — ABNORMAL HIGH (ref 20–29)
Calcium: 9.9 mg/dL (ref 8.7–10.2)
Chloride: 99 mmol/L (ref 96–106)
Creatinine, Ser: 0.83 mg/dL (ref 0.57–1.00)
GFR calc Af Amer: 89 mL/min/{1.73_m2} (ref >59)
GFR calc non Af Amer: 77 mL/min/{1.73_m2} (ref >59)
Globulin, Total: 2.9 g/dL (ref 1.5–4.5)
Glucose: 90 mg/dL (ref 65–99)
Potassium: 4.4 mmol/L (ref 3.5–5.2)
Sodium: 142 mmol/L (ref 134–144)
Total Protein: 7.2 g/dL (ref 6.0–8.5)

## 2020-03-30 LAB — LIPID PANEL
Chol/HDL Ratio: 2.5 ratio (ref 0.0–4.4)
Cholesterol, Total: 158 mg/dL (ref 100–199)
HDL: 62 mg/dL (ref >39)
LDL Chol Calc (NIH): 84 mg/dL (ref 0–99)
Triglycerides: 61 mg/dL (ref 0–149)
VLDL Cholesterol Cal: 12 mg/dL (ref 5–40)

## 2020-03-30 LAB — TSH: TSH: 1.18 u[IU]/mL (ref 0.450–4.500)

## 2020-05-11 ENCOUNTER — Ambulatory Visit
Admission: RE | Admit: 2020-05-11 | Discharge: 2020-05-11 | Disposition: A | Discharge status: Home / Self Care | Payer: BC Managed Care – PPO | Source: Ambulatory Visit | Attending: Family Medicine | Admitting: Family Medicine

## 2020-05-11 ENCOUNTER — Other Ambulatory Visit: Payer: Self-pay

## 2020-05-11 DIAGNOSIS — Z1231 Encounter for screening mammogram for malignant neoplasm of breast: Secondary | ICD-10-CM | POA: Diagnosis not present

## 2020-06-20 DIAGNOSIS — H2513 Age-related nuclear cataract, bilateral: Secondary | ICD-10-CM | POA: Diagnosis not present

## 2020-06-20 DIAGNOSIS — H50011 Monocular esotropia, right eye: Secondary | ICD-10-CM | POA: Diagnosis not present

## 2020-08-28 ENCOUNTER — Encounter: Payer: Self-pay | Admitting: Family Medicine

## 2020-08-28 ENCOUNTER — Ambulatory Visit (INDEPENDENT_AMBULATORY_CARE_PROVIDER_SITE_OTHER): Payer: BC Managed Care – PPO | Admitting: Family Medicine

## 2020-08-28 DIAGNOSIS — J0100 Acute maxillary sinusitis, unspecified: Secondary | ICD-10-CM

## 2020-08-28 LAB — VERITOR FLU A/B WAIVED
Influenza A: NEGATIVE
Influenza B: NEGATIVE

## 2020-08-28 MED ORDER — AZITHROMYCIN 250 MG PO TABS: ORAL_TABLET | ORAL | 0 refills | Status: DC

## 2020-08-28 MED ORDER — MOMETASONE FUROATE 50 MCG/ACT NA SUSP: 2.0000 | Freq: Every day | NASAL | 12 refills | Status: DC

## 2020-08-28 NOTE — Progress Notes (Signed)
Subjective:    Patient ID: Katherine Pearson, female    DOB: [birth year], 60 y.o.   MRN: [id number]   HPI: Katherine Pearson is a 60 y.o. female presenting for Symptoms include congestion, facial pain, nasal congestion, ears feel congested,  non productive cough, yellow post nasal drip and sinus pressure. Feels like previously diagnosed sinusitis. There is no fever, chills, or sweats. Onset of symptoms was 3-4 days ago, gradually worsening since that time.     Depression screen [facility] 2/9 01/11/2020 10/07/2019 05/04/2019 03/18/2019 02/01/2019  Decreased Interest 0 0 0 0 0  Down, Depressed, Hopeless 0 0 0 0 0  PHQ - 2 Score 0 0 0 0 0     Relevant past medical, surgical, family and social history reviewed and updated as indicated.  Interim medical history since our last visit reviewed. Allergies and medications reviewed and updated.  ROS:  Review of Systems   Social History   Tobacco Use  Smoking Status Never Smoker  Smokeless Tobacco Never Used       Objective:     Wt Readings from Last 3 Encounters:  03/29/20 179 lb (81.2 kg)  01/11/20 182 lb 9.6 oz (82.8 kg)  10/07/19 183 lb (83 kg)     Exam deferred. Pt. Harboring due to COVID 19. Phone visit performed.   Assessment & Plan:   1. Acute maxillary sinusitis, recurrence not specified     Meds ordered this encounter  Medications  . azithromycin (ZITHROMAX Z-PAK) 250 MG tablet    Sig: Take two right away Then one a day for the next 4 days.    Dispense:  6 each    Refill:  0  . mometasone (NASONEX) 50 MCG/ACT nasal spray    Sig: Place 2 sprays into the nose daily.    Dispense:  17 g    Refill:  12    Orders Placed This Encounter  Procedures  . Novel Coronavirus, NAA ([facility])    Order Specific Question:   Is this test for diagnosis or screening    Answer:   Diagnosis of ill patient    Order Specific Question:   Symptomatic for COVID-19 as defined by CDC    Answer:   Yes    Order Specific Question:   Date of Symptom  Onset    Answer:   08/24/2020    Order Specific Question:   Hospitalized for COVID-19    Answer:   No    Order Specific Question:   Admitted to ICU for COVID-19    Answer:   No    Order Specific Question:   Previously tested for COVID-19    Answer:   Yes    Order Specific Question:   Resident in a congregate (group) care setting    Answer:   No    Order Specific Question:   Is the patient student?    Answer:   No    Order Specific Question:   Employed in healthcare setting    Answer:   Yes    Order Specific Question:   Pregnant    Answer:   No    Order Specific Question:   Has patient completed COVID vaccination(s) (2 doses of Pfizer/Moderna 1 dose of [employer])    Answer:   No  . Veritor Flu A/B Waived    Order Specific Question:   Source    Answer:   nasal    Order Specific Question:   Release to  patient    Answer:   Immediate      Diagnoses and all orders for this visit:  Acute maxillary sinusitis, recurrence not specified -     azithromycin (ZITHROMAX Z-PAK) 250 MG tablet; Take two right away Then one a day for the next 4 days. -     mometasone (NASONEX) 50 MCG/ACT nasal spray; Place 2 sprays into the nose daily. -     Veritor Flu A/B Waived -     Novel Coronavirus, NAA ([facility])    Virtual Visit via telephone Note  I discussed the limitations, risks, security and privacy concerns of performing an evaluation and management service by telephone and the availability of in person appointments. The patient was identified with two identifiers. Pt.expressed understanding and agreed to proceed. Pt. Is at home. Dr. Livia Snellen is in his office.  Follow Up Instructions:   I discussed the assessment and treatment plan with the patient. The patient was provided an opportunity to ask questions and all were answered. The patient agreed with the plan and demonstrated an understanding of the instructions.   The patient was advised to call back or seek an in-person evaluation if the  symptoms worsen or if the condition fails to improve as anticipated.   Total minutes including chart review and phone contact time: 13   Follow up plan: Return if symptoms worsen or fail to improve.  Claretta Fraise, MD [place]

## 2020-08-29 LAB — NOVEL CORONAVIRUS, NAA: SARS-CoV-2, NAA: NOT DETECTED

## 2020-08-29 LAB — SARS-COV-2, NAA 2 DAY TAT

## 2020-08-30 ENCOUNTER — Encounter: Payer: Self-pay | Admitting: Family Medicine

## 2020-09-26 DIAGNOSIS — D225 Melanocytic nevi of trunk: Secondary | ICD-10-CM | POA: Diagnosis not present

## 2020-09-26 DIAGNOSIS — Z1283 Encounter for screening for malignant neoplasm of skin: Secondary | ICD-10-CM | POA: Diagnosis not present

## 2020-09-26 DIAGNOSIS — B353 Tinea pedis: Secondary | ICD-10-CM | POA: Diagnosis not present

## 2020-12-06 ENCOUNTER — Encounter: Payer: Self-pay | Admitting: Family Medicine

## 2020-12-06 ENCOUNTER — Ambulatory Visit (INDEPENDENT_AMBULATORY_CARE_PROVIDER_SITE_OTHER): Payer: BC Managed Care – PPO | Admitting: Family Medicine

## 2020-12-06 DIAGNOSIS — U071 COVID-19: Secondary | ICD-10-CM | POA: Diagnosis not present

## 2020-12-06 NOTE — Progress Notes (Signed)
   Virtual Visit  Note Due to COVID-19 pandemic this visit was conducted virtually. This visit type was conducted due to national recommendations for restrictions regarding the COVID-19 Pandemic (e.g. social distancing, sheltering in place) in an effort to limit this patient's exposure and mitigate transmission in our community. All issues noted in this document were discussed and addressed.  A physical exam was not performed with this format.  I connected with Katherine Pearson on 12/06/20 at 1305 by telephone and verified that I am speaking with the correct person using two identifiers. Katherine Pearson is currently located at home and no one is currently with her during the visit. The provider, Gwenlyn Perking, FNP is located in their office at time of visit.  I discussed the limitations, risks, security and privacy concerns of performing an evaluation and management service by telephone and the availability of in person appointments. I also discussed with the patient that there may be a patient responsible charge related to this service. The patient expressed understanding and agreed to proceed.  CC: Covid positive  History and Present Illness:  HPI  Katherine Pearson tested positive for Covid with a home Covid test this morning. She denies symptoms but decided to take a test because her boss notified her that she had tested positive. Katherine Pearson reports that she has intermittent nasal congestion and throat irritation regularly and this has been unchanged for her. She denies fever, chest pain, shortness of breath, nausea, vomiting, diarrhea, body aches, chills, fatigue, or loss of taste or smell. She has not been vaccinated.     ROS As per HPI.    Observations/Objective: Alert and oriented x 3. Able to speak in full sentences without difficulty.    Assessment and Plan: Feiga was seen today for covid positive.  Diagnoses and all orders for this visit:  COVID-19 Asymptomatic. Tested positive with home test  today. Quarantine for 5 days if remains asymptomatic. Wear mask for additional 5 days after quarantine.     Follow Up Instructions: As needed.     I discussed the assessment and treatment plan with the patient. The patient was provided an opportunity to ask questions and all were answered. The patient agreed with the plan and demonstrated an understanding of the instructions.   The patient was advised to call back or seek an in-person evaluation if the symptoms worsen or if the condition fails to improve as anticipated.  The above assessment and management plan was discussed with the patient. The patient verbalized understanding of and has agreed to the management plan. Patient is aware to call the clinic if symptoms persist or worsen. Patient is aware when to return to the clinic for a follow-up visit. Patient educated on when it is appropriate to go to the emergency department.   Time call ended:  1317  I provided 12 minutes of  non face-to-face time during this encounter.    Gwenlyn Perking, FNP

## 2021-01-02 ENCOUNTER — Encounter: Payer: Self-pay | Admitting: Family Medicine

## 2021-01-02 ENCOUNTER — Other Ambulatory Visit: Payer: Self-pay

## 2021-01-02 ENCOUNTER — Ambulatory Visit: Payer: BC Managed Care – PPO | Admitting: Family Medicine

## 2021-01-02 VITALS — BP 138/90 | HR 68 | Temp 97.5°F | Ht 64.0 in | Wt 183.0 lb

## 2021-01-02 DIAGNOSIS — N3001 Acute cystitis with hematuria: Secondary | ICD-10-CM | POA: Diagnosis not present

## 2021-01-02 DIAGNOSIS — R399 Unspecified symptoms and signs involving the genitourinary system: Secondary | ICD-10-CM | POA: Diagnosis not present

## 2021-01-02 LAB — URINALYSIS, ROUTINE W REFLEX MICROSCOPIC
Bilirubin, UA: NEGATIVE
Glucose, UA: NEGATIVE
Ketones, UA: NEGATIVE
Leukocytes,UA: NEGATIVE
Nitrite, UA: NEGATIVE
Protein,UA: NEGATIVE
Specific Gravity, UA: 1.01 (ref 1.005–1.030)
Urobilinogen, Ur: 0.2 mg/dL (ref 0.2–1.0)
pH, UA: 6.5 (ref 5.0–7.5)

## 2021-01-02 LAB — MICROSCOPIC EXAMINATION
Bacteria, UA: NONE SEEN
Epithelial Cells (non renal): NONE SEEN /hpf (ref 0–10)
WBC, UA: NONE SEEN /hpf (ref 0–5)

## 2021-01-02 MED ORDER — PHENAZOPYRIDINE HCL 100 MG PO TABS: 100.0000 mg | ORAL_TABLET | Freq: Three times a day (TID) | ORAL | 0 refills | Status: DC | PRN

## 2021-01-02 MED ORDER — NITROFURANTOIN MONOHYD MACRO 100 MG PO CAPS
100.0000 mg | ORAL_CAPSULE | Freq: Two times a day (BID) | ORAL | 0 refills | Status: AC
Start: 2021-01-02 — End: 2021-01-07

## 2021-01-02 NOTE — Progress Notes (Signed)
Subjective: CC: ?UTI PCP: Loman Brooklyn, FNP HUD:JSHFWY T Katherine Pearson is a 60 y.o. female presenting to clinic today for:  1. Urinary symptoms Patient reports a 2 day h/o dysuria, pelvic pressure and bloating.  Denies urinary frequency, urgency, hematuria, fevers, chills, nausea, vomiting, back pain, vaginal discharge.  Patient has used nothing for symptoms.  Patient denies a h/o frequent or recurrent UTIs.      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    Heart murmur    History of thyroid nodule    benign, left   Hyperlipidemia    Hypertension    Mitral valve prolapse    Vitamin D deficiency     Current Outpatient Medications:    acetaminophen (TYLENOL) 500 MG tablet, Take 500-1,000 mg by mouth every 6 (six) hours as needed (for pain/headaches.)., Disp: , Rfl:    atorvastatin (LIPITOR) 20 MG tablet, Take 1 tablet (20 mg total) by mouth daily., Disp: 90 tablet, Rfl: 3   azithromycin (ZITHROMAX Z-PAK) 250 MG tablet, Take two right away Then one a day for the next 4 days., Disp: 6 each, Rfl: 0   Bilberry 100 MG CAPS, Take 100 mg by mouth daily. , Disp: , Rfl:    Calcium Carb-Cholecalciferol (CALCIUM 600+D) 600-800 MG-UNIT TABS, Take 1 tablet by mouth daily., Disp: , Rfl:    lisinopril-hydrochlorothiazide (ZESTORETIC) 20-25 MG tablet, Take 1 tablet by mouth daily., Disp: 90 tablet, Rfl: 3   mometasone (NASONEX) 50 MCG/ACT nasal spray, Place 2 sprays into the nose daily., Disp: 17 g, Rfl: 12   Multiple Vitamin (MULTIVITAMIN WITH MINERALS) TABS tablet, Take 1 tablet by mouth daily., Disp: , Rfl:    vitamin C (ASCORBIC ACID) 500 MG tablet, Take 500 mg by mouth every 14 (fourteen) days. Chewable, Disp: , Rfl:  Social History   Socioeconomic History   Marital status: Married    Spouse name: Gwyndolyn Saxon   Number of children: 2    Years of education: Not on file   Highest education level: Not on file  Occupational History   Not on file  Tobacco Use   Smoking status: Never   Smokeless tobacco: Never  Vaping Use   Vaping Use: Never used  Substance and Sexual Activity   Alcohol use: Yes    Comment: social   Drug use: Never   Sexual activity: Not on file  Other Topics Concern   Not on file  Social History Narrative   Not on file   Social Determinants of Health   Financial Resource Strain: Not on file  Food Insecurity: Not on file  Transportation Needs: Not on file  Physical Activity: Not on file  Stress: Not on file  Social Connections: Not on file  Intimate Partner Violence: Not on file   Family History  Problem Relation Age of Onset   Breast cancer Mother    Depression Mother    Hypertension Mother    Drug abuse Brother    Arthritis Maternal Grandmother    Hypertension Maternal Grandmother    Gout Maternal Grandmother    Heart disease Maternal Grandmother    Diabetes Maternal Grandfather    Heart disease Maternal Grandfather    Obesity Maternal Grandfather    Aneurysm Paternal Grandmother    Heart murmur Father    Heart disease  Paternal Grandfather    Heart defect Son     Objective: Office vital signs reviewed. BP 138/90   Pulse 68   Temp (!) 97.5 F (36.4 C)   Ht 5\' 4"  (1.626 m)   Wt 183 lb (83 kg)   SpO2 98%   BMI 31.41 kg/m   Physical Examination:  General: Awake, alert, nontoxic, No acute distress GU: Suprapubic tenderness present.  No CVA tenderness.  Assessment/ Plan: 60 y.o. female   Acute cystitis with hematuria - Plan: Urinalysis, Routine w reflex microscopic, nitrofurantoin, macrocrystal-monohydrate, (MACROBID) 100 MG capsule, phenazopyridine (PYRIDIUM) 100 MG tablet, Urine Culture  1+ blood noted on urine dip.  Urine microscopy uninformative.  Urine culture has been sent given blood noted on urine dip.  Symptomatically she sounds like she has urinary tract  infection therefore we will treat with Macrobid.  Pyridium sent.  Return precautions discussed.  Follow-up as needed  No orders of the defined types were placed in this encounter.  No orders of the defined types were placed in this encounter.    Janora Norlander, DO [place] [phone]

## 2021-01-04 LAB — URINE CULTURE

## 2021-01-18 ENCOUNTER — Other Ambulatory Visit: Payer: Self-pay

## 2021-01-18 ENCOUNTER — Ambulatory Visit (INDEPENDENT_AMBULATORY_CARE_PROVIDER_SITE_OTHER): Payer: BC Managed Care – PPO | Admitting: Family Medicine

## 2021-01-18 ENCOUNTER — Encounter: Payer: Self-pay | Admitting: Family Medicine

## 2021-01-18 ENCOUNTER — Other Ambulatory Visit (HOSPITAL_COMMUNITY)
Admission: RE | Admit: 2021-01-18 | Discharge: 2021-01-18 | Disposition: A | Discharge status: Home / Self Care | Payer: BC Managed Care – PPO | Source: Ambulatory Visit | Attending: Family Medicine | Admitting: Family Medicine

## 2021-01-18 VITALS — BP 123/77 | HR 98 | Temp 96.1°F | Ht 64.0 in | Wt 183.0 lb

## 2021-01-18 DIAGNOSIS — E559 Vitamin D deficiency, unspecified: Secondary | ICD-10-CM

## 2021-01-18 DIAGNOSIS — Z124 Encounter for screening for malignant neoplasm of cervix: Secondary | ICD-10-CM | POA: Insufficient documentation

## 2021-01-18 DIAGNOSIS — Z1151 Encounter for screening for human papillomavirus (HPV): Secondary | ICD-10-CM | POA: Diagnosis not present

## 2021-01-18 DIAGNOSIS — E669 Obesity, unspecified: Secondary | ICD-10-CM

## 2021-01-18 DIAGNOSIS — Z8639 Personal history of other endocrine, nutritional and metabolic disease: Secondary | ICD-10-CM | POA: Diagnosis not present

## 2021-01-18 DIAGNOSIS — Z113 Encounter for screening for infections with a predominantly sexual mode of transmission: Secondary | ICD-10-CM | POA: Diagnosis not present

## 2021-01-18 DIAGNOSIS — Z0001 Encounter for general adult medical examination with abnormal findings: Secondary | ICD-10-CM

## 2021-01-18 DIAGNOSIS — Z0000 Encounter for general adult medical examination without abnormal findings: Secondary | ICD-10-CM | POA: Diagnosis not present

## 2021-01-18 DIAGNOSIS — E782 Mixed hyperlipidemia: Secondary | ICD-10-CM

## 2021-01-18 DIAGNOSIS — I10 Essential (primary) hypertension: Secondary | ICD-10-CM

## 2021-01-18 DIAGNOSIS — E041 Nontoxic single thyroid nodule: Secondary | ICD-10-CM

## 2021-01-18 MED ORDER — LISINOPRIL-HYDROCHLOROTHIAZIDE 20-25 MG PO TABS: 1.0000 | ORAL_TABLET | Freq: Every day | ORAL | 3 refills | Status: DC

## 2021-01-18 MED ORDER — ATORVASTATIN CALCIUM 20 MG PO TABS: 20.0000 mg | ORAL_TABLET | Freq: Every day | ORAL | 3 refills | Status: DC

## 2021-01-18 NOTE — Progress Notes (Signed)
Assessment & Plan:  1. Well adult exam Preventive health education provided. Patient declined COVID and Shingrix.  - CBC with Differential/Platelet - CMP14+EGFR - Lipid panel  2. Essential hypertension Well controlled on current regimen.  - lisinopril-hydrochlorothiazide (ZESTORETIC) 20-25 MG tablet; Take 1 tablet by mouth daily.  Dispense: 90 tablet; Refill: 3 - CBC with Differential/Platelet - CMP14+EGFR - Lipid panel - TSH  3. Mixed hyperlipidemia Well controlled on current regimen.  - atorvastatin (LIPITOR) 20 MG tablet; Take 1 tablet (20 mg total) by mouth daily.  Dispense: 90 tablet; Refill: 3 - CMP14+EGFR - Lipid panel  4. Vitamin D deficiency Well controlled on current regimen.  - VITAMIN D 25 Hydroxy (Vit-D Deficiency, Fractures)  5. Right thyroid nodule No thyroid nodule meets criteria for biopsy or surveillance per the thyroid US in 2020. - TSH  6. Obesity (BMI 30.0-34.9) Diet and exercise encouraged.  - CBC with Differential/Platelet - CMP14+EGFR - Lipid panel  7. Screening for cervical cancer - Cytology - PAP  8. Routine screening for STI (sexually transmitted infection) - Cytology - PAP  9. Screening for human papillomavirus (HPV) - Cytology - PAP   Follow-up: Return in about 1 year (around 01/18/2022) for annual physical.   Hendricks Limes, MSN, APRN, FNP-C [facility]  Subjective:  Patient ID: Katherine Pearson, female    DOB: 01/19/1961  Age: 60 y.o. MRN: [id number]  Patient Care Team: Loman Brooklyn, FNP as PCP - General (Family Medicine) Danie Binder, MD (Inactive) as Consulting Physician (Gastroenterology)   CC:  Chief Complaint  Patient presents with   Gynecologic Exam   lower abd pain    Patient states that she has lower abd pain on and off x 2 weeks     HPI Katherine Pearson presents for her annual physical.   Work: Psychologist, counselling, Marital status: married, Substance use: none Diet: regular, Exercise:  none Last eye exam: December 2021 Last dental exam: Goes every 6 months Last colonoscopy: 07/29/2019 Last mammogram: 05/11/2020 Last pap smear: 10/07/2019 Hepatitis C Screening: completed Immunizations: Flu Vaccine: declined Tdap Vaccine: up to date  Shingrix Vaccine: declined COVID-19 Vaccine: declined     Review of Systems  Constitutional:  Negative for chills, fever, malaise/fatigue and weight loss.  HENT:  Negative for congestion, ear discharge, ear pain, nosebleeds, sinus pain, sore throat and tinnitus.   Eyes:  Negative for blurred vision, double vision, pain, discharge and redness.  Respiratory:  Negative for cough, shortness of breath and wheezing.   Cardiovascular:  Negative for chest pain, palpitations and leg swelling.  Gastrointestinal:  Positive for constipation (controlled with increased fiber). Negative for abdominal pain, diarrhea, heartburn, nausea and vomiting.  Genitourinary:  Negative for dysuria, frequency and urgency.  Musculoskeletal:  Negative for myalgias.  Skin:  Negative for rash.  Neurological:  Negative for dizziness, seizures, weakness and headaches.  Psychiatric/Behavioral:  Negative for depression, substance abuse and suicidal ideas. The patient is not nervous/anxious.     Current Outpatient Medications:    acetaminophen (TYLENOL) 500 MG tablet, Take 500-1,000 mg by mouth every 6 (six) hours as needed (for pain/headaches.)., Disp: , Rfl:    atorvastatin (LIPITOR) 20 MG tablet, Take 1 tablet (20 mg total) by mouth daily., Disp: 90 tablet, Rfl: 3   Bilberry 100 MG CAPS, Take 100 mg by mouth daily. , Disp: , Rfl:    Calcium Carb-Cholecalciferol (CALCIUM 600+D) 600-800 MG-UNIT TABS, Take 1 tablet by mouth daily., Disp: , Rfl:  lisinopril-hydrochlorothiazide (ZESTORETIC) 20-25 MG tablet, Take 1 tablet by mouth daily., Disp: 90 tablet, Rfl: 3   mometasone (NASONEX) 50 MCG/ACT nasal spray, Place 2 sprays into the nose daily., Disp: 17 g, Rfl: 12   Multiple  Vitamin (MULTIVITAMIN WITH MINERALS) TABS tablet, Take 1 tablet by mouth daily., Disp: , Rfl:    vitamin C (ASCORBIC ACID) 500 MG tablet, Take 500 mg by mouth every 14 (fourteen) days. 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    Heart murmur    History of thyroid nodule    benign, left   Hyperlipidemia    Hypertension    Mitral valve prolapse    Vitamin D deficiency     Past Surgical History:  Procedure Laterality Date   BASAL CELL CARCINOMA EXCISION     COLONOSCOPY N/A 07/29/2019   Procedure: COLONOSCOPY;  Surgeon: Daneil Dolin, MD;  Location: [facility];  Service: Endoscopy;  Laterality: N/A;  10:30   POLYPECTOMY  07/29/2019   Procedure: POLYPECTOMY;  Surgeon: Daneil Dolin, MD;  Location: [facility];  Service: Endoscopy;;  recto-sigmoid    Family History  Problem Relation Age of Onset   Breast cancer Mother    Depression Mother    Hypertension Mother    Drug abuse Brother    Arthritis Maternal Grandmother    Hypertension Maternal Grandmother    Gout Maternal Grandmother    Heart disease Maternal Grandmother    Diabetes Maternal Grandfather    Heart disease Maternal Grandfather    Obesity Maternal Grandfather    Aneurysm Paternal Grandmother    Heart murmur Father    Heart disease Paternal Grandfather    Heart defect Son     Social History   Socioeconomic History   Marital status: Married    Spouse name: Gwyndolyn Saxon   Number of children: 2   Years of education: Not on file   Highest education level: Not on file  Occupational History   Not on file  Tobacco Use   Smoking status: Never   Smokeless tobacco: Never  Vaping Use   Vaping Use: Never used  Substance and Sexual Activity   Alcohol use: Yes    Comment: social   Drug use: Never   Sexual activity: Not on file  Other  Topics Concern   Not on file  Social History Narrative   Not on file   Social Determinants of Health   Financial Resource Strain: Not on file  Food Insecurity: Not on file  Transportation Needs: Not on file  Physical Activity: Not on file  Stress: Not on file  Social Connections: Not on file  Intimate Partner Violence: Not on file      Objective:    BP 123/77   Pulse 98   Temp (!) 96.1 F (35.6 C) (Temporal)   Ht $R'5\' 4"'ZY$  (1.626 m)   Wt 183 lb (83 kg)   SpO2 95%   BMI 31.41 kg/m   Wt Readings from Last 3 Encounters:  01/18/21 183 lb (83 kg)  01/02/21 183 lb (83 kg)  03/29/20 179 lb (81.2 kg)    Physical Exam Vitals reviewed. Exam conducted with a chaperone present.  Constitutional:      General: She is not in acute distress.    Appearance: Normal  appearance. She is obese. She is not ill-appearing, toxic-appearing or diaphoretic.  HENT:     Head: Normocephalic and atraumatic.     Right Ear: Tympanic membrane, ear canal and external ear normal. There is no impacted cerumen.     Left Ear: Tympanic membrane, ear canal and external ear normal. There is no impacted cerumen.     Nose: Nose normal. No congestion or rhinorrhea.     Mouth/Throat:     Mouth: Mucous membranes are moist.     Pharynx: Oropharynx is clear. No oropharyngeal exudate or posterior oropharyngeal erythema.  Eyes:     General: No scleral icterus.       Right eye: No discharge.        Left eye: No discharge.     Conjunctiva/sclera: Conjunctivae normal.     Pupils: Pupils are equal, round, and reactive to light.  Cardiovascular:     Rate and Rhythm: Normal rate and regular rhythm.     Heart sounds: Normal heart sounds. No murmur heard.   No friction rub. No gallop.  Pulmonary:     Effort: Pulmonary effort is normal. No respiratory distress.     Breath sounds: Normal breath sounds. No stridor. No wheezing, rhonchi or rales.  Chest:     Chest wall: No mass, lacerations, deformity, swelling,  tenderness, crepitus or edema. There is no dullness to percussion.  Breasts:    Breasts are symmetrical.     Right: Normal. No axillary adenopathy or supraclavicular adenopathy.     Left: Normal. No axillary adenopathy or supraclavicular adenopathy.  Abdominal:     General: Abdomen is flat. Bowel sounds are normal. There is no distension.     Palpations: Abdomen is soft. There is no mass.     Tenderness: There is no abdominal tenderness. There is no guarding or rebound.     Hernia: No hernia is present.  Genitourinary:    Pubic Area: No rash or pubic lice.      Labia:        Right: No rash, tenderness, lesion or injury.        Left: No rash, tenderness, lesion or injury.      Vagina: Normal.     Cervix: Normal.     Uterus: Normal.      Adnexa: Right adnexa normal and left adnexa normal.  Musculoskeletal:        General: Normal range of motion.     Cervical back: Normal range of motion and neck supple. No rigidity. No muscular tenderness.  Lymphadenopathy:     Cervical: No cervical adenopathy.     Upper Body:     Right upper body: No supraclavicular, axillary or pectoral adenopathy.     Left upper body: No supraclavicular, axillary or pectoral adenopathy.  Skin:    General: Skin is warm and dry.     Capillary Refill: Capillary refill takes less than 2 seconds.  Neurological:     General: No focal deficit present.     Mental Status: She is alert and oriented to person, place, and time. Mental status is at baseline.  Psychiatric:        Mood and Affect: Mood normal.        Behavior: Behavior normal.        Thought Content: Thought content normal.        Judgment: Judgment normal.    Lab Results  Component Value Date   TSH 1.180 03/29/2020   Lab Results  Component Value Date  WBC 5.6 10/07/2019   HGB 14.4 10/07/2019   HCT 42.6 10/07/2019   MCV 91 10/07/2019   PLT 221 10/07/2019   Lab Results  Component Value Date   NA 142 03/29/2020   K 4.4 03/29/2020   CO2 31 (H)  03/29/2020   GLUCOSE 90 03/29/2020   BUN 15 03/29/2020   CREATININE 0.83 03/29/2020   BILITOT 0.3 03/29/2020   ALKPHOS 47 03/29/2020   AST 20 03/29/2020   ALT 18 03/29/2020   PROT 7.2 03/29/2020   ALBUMIN 4.3 03/29/2020   CALCIUM 9.9 03/29/2020   Lab Results  Component Value Date   CHOL 158 03/29/2020   Lab Results  Component Value Date   HDL 62 03/29/2020   Lab Results  Component Value Date   LDLCALC 84 03/29/2020   Lab Results  Component Value Date   TRIG 61 03/29/2020   Lab Results  Component Value Date   CHOLHDL 2.5 03/29/2020   No results found for: HGBA1C

## 2021-01-18 NOTE — Patient Instructions (Signed)
Preventive Care 60-60 Years Oldld, Female Preventive care refers to lifestyle choices and visits with your health care provider that can promote health and wellness. This includes: A yearly physical exam. This is also called an annual wellness visit. Regular dental and eye exams. Immunizations. Screening for certain conditions. Healthy lifestyle choices, such as: Eating a healthy diet. Getting regular exercise. Not using drugs or products that contain nicotine and tobacco. Limiting alcohol use. What can I expect for my preventive care visit? Physical exam Your health care provider will check your: Height and weight. These may be used to calculate your BMI (body mass index). BMI is a measurement that tells if you are at a healthy weight. Heart rate and blood pressure. Body temperature. Skin for abnormal spots. Counseling Your health care provider may ask you questions about your: Past medical problems. Family's medical history. Alcohol, tobacco, and drug use. Emotional well-being. Home life and relationship well-being. Sexual activity. Diet, exercise, and sleep habits. Work and work Statistician. Access to firearms. Method of birth control. Menstrual cycle. Pregnancy history. What immunizations do I need?  Vaccines are usually given at various ages, according to a schedule. Your health care provider will recommend vaccines for you based on your age, medicalhistory, and lifestyle or other factors, such as travel or where you work. What tests do I need? Blood tests Lipid and cholesterol levels. These may be checked every 60 years, or more often if you are over 37 years old. Hepatitis C test. Hepatitis B test. Screening Lung cancer screening. You may have this screening every year starting at age 60 if you have a 30-pack-year history of smoking and currently smoke or have quit within the past 15 years. Colorectal cancer screening. All adults should have this screening starting at  age 60 and continuing until age 60. Your health care provider may recommend screening at age 60 if you are at increased risk. You will have tests every 1-10 years, depending on your results and the type of screening test. Diabetes screening. This is done by checking your blood sugar (glucose) after you have not eaten for a while (fasting). You may have this done every 1-3 years. Mammogram. This may be done every 1-2 years. Talk with your health care provider about when you should start having regular mammograms. This may depend on whether you have a family history of breast cancer. BRCA-related cancer screening. This may be done if you have a family history of breast, ovarian, tubal, or peritoneal cancers. Pelvic exam and Pap test. This may be done every 3 years starting at age 60. Starting at age 60, this may be done every 5 years if you have a Pap test in combination with an HPV test. Other tests STD (sexually transmitted disease) testing, if you are at risk. Bone density scan. This is done to screen for osteoporosis. You may have this scan if you are at high risk for osteoporosis. Talk with your health care provider about your test results, treatment options,and if necessary, the need for more tests. Follow these instructions at home: Eating and drinking  Eat a diet that includes fresh fruits and vegetables, whole grains, lean protein, and low-fat dairy products. Take vitamin and mineral supplements as recommended by your health care provider. Do not drink alcohol if: Your health care provider tells you not to drink. You are pregnant, may be pregnant, or are planning to become pregnant. If you drink alcohol: Limit how much you have to 0-1 drink a day. Be aware  of how much alcohol is in your drink. In the [place] one drink equals one 12 oz bottle of beer (355 mL), one 5 oz glass of wine (148 mL), or one 1 oz glass of hard liquor (44 mL).  Lifestyle Take daily care of your teeth and  gums. Brush your teeth every morning and night with fluoride toothpaste. Floss one time each day. Stay active. Exercise for at least 30 minutes 5 or more days each week. Do not use any products that contain nicotine or tobacco, such as cigarettes, e-cigarettes, and chewing tobacco. If you need help quitting, ask your health care provider. Do not use drugs. If you are sexually active, practice safe sex. Use a condom or other form of protection to prevent STIs (sexually transmitted infections). If you do not wish to become pregnant, use a form of birth control. If you plan to become pregnant, see your health care provider for a prepregnancy visit. If told by your health care provider, take low-dose aspirin daily starting at age 29. Find healthy ways to cope with stress, such as: Meditation, yoga, or listening to music. Journaling. Talking to a trusted person. Spending time with friends and family. Safety Always wear your seat belt while driving or riding in a vehicle. Do not drive: If you have been drinking alcohol. Do not ride with someone who has been drinking. When you are tired or distracted. While texting. Wear a helmet and other protective equipment during sports activities. If you have firearms in your house, make sure you follow all gun safety procedures. What's next? Visit your health care provider once a year for an annual wellness visit. Ask your health care provider how often you should have your eyes and teeth checked. Stay up to date on all vaccines. This information is not intended to replace advice given to you by your health care provider. Make sure you discuss any questions you have with your healthcare provider. Document Revised: 04/03/2020 Document Reviewed: 03/11/2018 Elsevier Patient Education  2022 [employer].

## 2021-01-19 LAB — CMP14+EGFR
ALT: 16 IU/L (ref 0–32)
AST: 19 IU/L (ref 0–40)
Albumin/Globulin Ratio: 1.6 (ref 1.2–2.2)
Albumin: 4.4 g/dL (ref 3.8–4.9)
Alkaline Phosphatase: 53 IU/L (ref 44–121)
BUN/Creatinine Ratio: 21 (ref 12–28)
BUN: 19 mg/dL (ref 8–27)
Bilirubin Total: 0.4 mg/dL (ref 0.0–1.2)
CO2: 30 mmol/L — ABNORMAL HIGH (ref 20–29)
Calcium: 10.1 mg/dL (ref 8.7–10.3)
Chloride: 97 mmol/L (ref 96–106)
Creatinine, Ser: 0.91 mg/dL (ref 0.57–1.00)
Globulin, Total: 2.7 g/dL (ref 1.5–4.5)
Glucose: 94 mg/dL (ref 65–99)
Potassium: 4 mmol/L (ref 3.5–5.2)
Sodium: 143 mmol/L (ref 134–144)
Total Protein: 7.1 g/dL (ref 6.0–8.5)
eGFR: 72 mL/min/{1.73_m2} (ref >59)

## 2021-01-19 LAB — LIPID PANEL
Chol/HDL Ratio: 2.7 ratio (ref 0.0–4.4)
Cholesterol, Total: 156 mg/dL (ref 100–199)
HDL: 58 mg/dL (ref >39)
LDL Chol Calc (NIH): 80 mg/dL (ref 0–99)
Triglycerides: 99 mg/dL (ref 0–149)
VLDL Cholesterol Cal: 18 mg/dL (ref 5–40)

## 2021-01-19 LAB — CBC WITH DIFFERENTIAL/PLATELET
Basophils Absolute: 0 10*3/uL (ref 0.0–0.2)
Basos: 1 %
EOS (ABSOLUTE): 0.1 10*3/uL (ref 0.0–0.4)
Eos: 2 %
Hematocrit: 43.1 % (ref 34.0–46.6)
Hemoglobin: 14.4 g/dL (ref 11.1–15.9)
Immature Grans (Abs): 0 10*3/uL (ref 0.0–0.1)
Immature Granulocytes: 0 %
Lymphocytes Absolute: 2 10*3/uL (ref 0.7–3.1)
Lymphs: 34 %
MCH: 31.1 pg (ref 26.6–33.0)
MCHC: 33.4 g/dL (ref 31.5–35.7)
MCV: 93 fL (ref 79–97)
Monocytes Absolute: 0.5 10*3/uL (ref 0.1–0.9)
Monocytes: 8 %
Neutrophils Absolute: 3.2 10*3/uL (ref 1.4–7.0)
Neutrophils: 55 %
Platelets: 225 10*3/uL (ref 150–450)
RBC: 4.63 x10E6/uL (ref 3.77–5.28)
RDW: 12.4 % (ref 11.7–15.4)
WBC: 5.8 10*3/uL (ref 3.4–10.8)

## 2021-01-19 LAB — TSH: TSH: 1.31 u[IU]/mL (ref 0.450–4.500)

## 2021-01-19 LAB — VITAMIN D 25 HYDROXY (VIT D DEFICIENCY, FRACTURES): Vit D, 25-Hydroxy: 53.4 ng/mL (ref 30.0–100.0)

## 2021-01-23 LAB — CYTOLOGY - PAP
Chlamydia: NEGATIVE
Comment: NEGATIVE
Comment: NEGATIVE
Comment: NEGATIVE
Comment: NORMAL
Diagnosis: NEGATIVE
High risk HPV: NEGATIVE
Neisseria Gonorrhea: NEGATIVE
Trichomonas: NEGATIVE

## 2021-03-05 ENCOUNTER — Other Ambulatory Visit: Payer: Self-pay | Admitting: Family Medicine

## 2021-03-05 DIAGNOSIS — Z1231 Encounter for screening mammogram for malignant neoplasm of breast: Secondary | ICD-10-CM

## 2021-06-12 ENCOUNTER — Ambulatory Visit: Payer: BC Managed Care – PPO | Admitting: Nurse Practitioner

## 2021-06-12 ENCOUNTER — Encounter: Payer: Self-pay | Admitting: Nurse Practitioner

## 2021-06-12 DIAGNOSIS — J320 Chronic maxillary sinusitis: Secondary | ICD-10-CM | POA: Diagnosis not present

## 2021-06-12 MED ORDER — AZITHROMYCIN 250 MG PO TABS: ORAL_TABLET | ORAL | 0 refills | Status: AC

## 2021-06-12 NOTE — Progress Notes (Signed)
   Virtual Visit  Note Due to COVID-19 pandemic this visit was conducted virtually. This visit type was conducted due to national recommendations for restrictions regarding the COVID-19 Pandemic (e.g. social distancing, sheltering in place) in an effort to limit this patient's exposure and mitigate transmission in our community. All issues noted in this document were discussed and addressed.  A physical exam was not performed with this format.  I connected with Katherine Pearson on 06/12/21 at 3 PM by telephone and verified that I am speaking with the correct person using two identifiers. Katherine Pearson is currently located at home during visit. The provider, Ivy Lynn, NP is located in their office at time of visit.  I discussed the limitations, risks, security and privacy concerns of performing an evaluation and management service by telephone and the availability of in person appointments. I also discussed with the patient that there may be a patient responsible charge related to this service. The patient expressed understanding and agreed to proceed.   History and Present Illness:  Sinusitis This is a recurrent problem. Episode onset: In the past 3 to 4 days. The problem is unchanged. There has been no fever. Associated symptoms include congestion. Pertinent negatives include no coughing, ear pain, shortness of breath, sore throat or swollen glands. Past treatments include nothing.     Review of Systems  HENT:  Positive for congestion and sinus pain. Negative for ear pain and sore throat.   Respiratory:  Negative for cough and shortness of breath.   Skin:  Negative for rash.  All other systems reviewed and are negative.   Observations/Objective: Televisit patient not in distress  Assessment and Plan: Take meds as prescribed - Use a cool mist humidifier  -Use saline nose sprays frequently -Force fluids -For fever or aches or pains- take Tylenol or ibuprofen. -Azithromycin 500 mg  tablet by mouth day 1, 250 mg tablet by mouth day 2-5. -If symptoms do not improve, she may need to be COVID tested to rule this out Follow up with worsening unresolved symptoms   Follow Up Instructions: Follow-up with worsening unresolved symptoms    I discussed the assessment and treatment plan with the patient. The patient was provided an opportunity to ask questions and all were answered. The patient agreed with the plan and demonstrated an understanding of the instructions.   The patient was advised to call back or seek an in-person evaluation if the symptoms worsen or if the condition fails to improve as anticipated.  The above assessment and management plan was discussed with the patient. The patient verbalized understanding of and has agreed to the management plan. Patient is aware to call the clinic if symptoms persist or worsen. Patient is aware when to return to the clinic for a follow-up visit. Patient educated on when it is appropriate to go to the emergency department.   Time call ended: 3:10 PM  I provided 10 minutes of  non face-to-face time during this encounter.    Ivy Lynn, NP

## 2021-06-12 NOTE — Patient Instructions (Signed)

## 2021-06-14 ENCOUNTER — Other Ambulatory Visit: Payer: Self-pay

## 2021-06-14 ENCOUNTER — Ambulatory Visit
Admission: RE | Admit: 2021-06-14 | Discharge: 2021-06-14 | Disposition: A | Discharge status: Home / Self Care | Payer: BC Managed Care – PPO | Source: Ambulatory Visit | Attending: Family Medicine | Admitting: Family Medicine

## 2021-06-14 DIAGNOSIS — Z1231 Encounter for screening mammogram for malignant neoplasm of breast: Secondary | ICD-10-CM

## 2021-06-19 DIAGNOSIS — H2513 Age-related nuclear cataract, bilateral: Secondary | ICD-10-CM | POA: Diagnosis not present

## 2021-06-19 DIAGNOSIS — H50011 Monocular esotropia, right eye: Secondary | ICD-10-CM | POA: Diagnosis not present

## 2022-01-10 ENCOUNTER — Encounter: Payer: Self-pay | Admitting: *Deleted

## 2022-01-21 ENCOUNTER — Encounter: Payer: BC Managed Care – PPO | Admitting: Family Medicine

## 2022-01-24 ENCOUNTER — Encounter: Payer: BC Managed Care – PPO | Admitting: Family Medicine

## 2022-01-31 ENCOUNTER — Ambulatory Visit (INDEPENDENT_AMBULATORY_CARE_PROVIDER_SITE_OTHER): Payer: BC Managed Care – PPO | Admitting: Family Medicine

## 2022-01-31 ENCOUNTER — Encounter: Payer: Self-pay | Admitting: Family Medicine

## 2022-01-31 VITALS — BP 143/89 | HR 70 | Temp 98.6°F | Ht 64.0 in | Wt 190.0 lb

## 2022-01-31 DIAGNOSIS — I10 Essential (primary) hypertension: Secondary | ICD-10-CM | POA: Diagnosis not present

## 2022-01-31 DIAGNOSIS — Z1231 Encounter for screening mammogram for malignant neoplasm of breast: Secondary | ICD-10-CM | POA: Diagnosis not present

## 2022-01-31 DIAGNOSIS — Z6833 Body mass index (BMI) 33.0-33.9, adult: Secondary | ICD-10-CM | POA: Insufficient documentation

## 2022-01-31 DIAGNOSIS — E782 Mixed hyperlipidemia: Secondary | ICD-10-CM

## 2022-01-31 DIAGNOSIS — Z0001 Encounter for general adult medical examination with abnormal findings: Secondary | ICD-10-CM | POA: Diagnosis not present

## 2022-01-31 DIAGNOSIS — Z6832 Body mass index (BMI) 32.0-32.9, adult: Secondary | ICD-10-CM

## 2022-01-31 DIAGNOSIS — E559 Vitamin D deficiency, unspecified: Secondary | ICD-10-CM

## 2022-01-31 DIAGNOSIS — Z0000 Encounter for general adult medical examination without abnormal findings: Secondary | ICD-10-CM

## 2022-01-31 DIAGNOSIS — R6889 Other general symptoms and signs: Secondary | ICD-10-CM | POA: Diagnosis not present

## 2022-01-31 DIAGNOSIS — Z6835 Body mass index (BMI) 35.0-35.9, adult: Secondary | ICD-10-CM | POA: Insufficient documentation

## 2022-01-31 MED ORDER — LOSARTAN POTASSIUM-HCTZ 50-12.5 MG PO TABS: 1.0000 | ORAL_TABLET | Freq: Every day | ORAL | 3 refills | Status: DC

## 2022-01-31 NOTE — Progress Notes (Signed)
Katherine Pearson is a 61 y.o. female presents to office today for annual physical exam examination.    Concerns today include: Dry cough which seems to be worsening. No concerns for GERD. 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    Heart murmur    History of thyroid nodule    benign, left   Hyperlipidemia    Hypertension    Mitral valve prolapse    Vitamin D deficiency    Social History   Socioeconomic History   Marital status: Married    Spouse name: Chrissie Noa   Number of children: 2   Years of education: Not on file   Highest education level: Not on file  Occupational History   Occupation: [employer]  Tobacco Use   Smoking status: Never   Smokeless tobacco: Never  Vaping Use   Vaping Use: Never used  Substance and Sexual Activity   Alcohol use: Yes    Comment: social   Drug use: Never   Sexual activity: Not on file  Other Topics Concern   Not on file  Social History Narrative   Not on file   Social Determinants of Health   Financial Resource Strain: Not on file  Food Insecurity: Not on file  Transportation Needs: Not on file  Physical Activity: Not on file  Stress: Not on file  Social Connections: Not on file  Intimate Partner Violence: Not on file   Past Surgical History:  Procedure Laterality Date   BASAL CELL CARCINOMA EXCISION     COLONOSCOPY N/A 07/29/2019   Procedure: COLONOSCOPY;  Surgeon: Corbin Ade, MD;  Location: [facility];  Service:  Endoscopy;  Laterality: N/A;  10:30   POLYPECTOMY  07/29/2019   Procedure: POLYPECTOMY;  Surgeon: Corbin Ade, MD;  Location: [facility];  Service: Endoscopy;;  recto-sigmoid   Family History  Problem Relation Age of Onset   Breast cancer Mother    Depression Mother    Hypertension Mother    Drug abuse Brother    Arthritis Maternal Grandmother    Hypertension Maternal Grandmother    Gout Maternal Grandmother    Heart disease Maternal Grandmother    Diabetes Maternal Grandfather    Heart disease Maternal Grandfather    Obesity Maternal Grandfather    Aneurysm Paternal Grandmother    Heart murmur Father    Heart disease Paternal Grandfather    Heart defect Son     Current Outpatient Medications:    acetaminophen (TYLENOL) 500 MG tablet, Take 500-1,000 mg by mouth every 6 (six) hours as needed (for pain/headaches.)., Disp: , Rfl:    atorvastatin (LIPITOR) 20 MG tablet, Take 1 tablet (20 mg total) by mouth daily., Disp: 90 tablet, Rfl: 3   Bilberry 100 MG CAPS, Take 100 mg by mouth daily. , Disp: , Rfl:    Calcium Carb-Cholecalciferol (CALCIUM 600+D) 600-800 MG-UNIT TABS, Take 1 tablet by mouth daily., Disp: , Rfl:    losartan-hydrochlorothiazide (HYZAAR) 50-12.5 MG tablet, Take 1 tablet by mouth daily., Disp: 90 tablet,  Rfl: 3   mometasone (NASONEX) 50 MCG/ACT nasal spray, Place 2 sprays into the nose daily., Disp: 17 g, Rfl: 12   Multiple Vitamin (MULTIVITAMIN WITH MINERALS) TABS tablet, Take 1 tablet by mouth daily., Disp: , Rfl:    vitamin C (ASCORBIC ACID) 500 MG tablet, Take 500 mg by mouth every 14 (fourteen) days. Chewable, Disp: , Rfl:   Allergies  Allergen Reactions   Cefuroxime Axetil Rash     Review of Systems  Constitutional:  Negative for chills, diaphoresis, fever, malaise/fatigue and weight loss.  HENT:  Negative for congestion, ear discharge, ear pain, hearing loss, nosebleeds, sinus pain, sore throat and tinnitus.   Eyes:  Negative for blurred vision,  double vision, photophobia, pain, discharge and redness.  Respiratory:  Positive for cough. Negative for hemoptysis, sputum production, shortness of breath, wheezing and stridor.   Cardiovascular:  Negative for chest pain, palpitations, orthopnea, claudication, leg swelling and PND.  Gastrointestinal:  Negative for abdominal pain, blood in stool, constipation, diarrhea, heartburn, melena, nausea and vomiting.  Genitourinary:  Negative for dysuria, flank pain, frequency, hematuria and urgency.  Musculoskeletal:  Negative for back pain, falls, joint pain, myalgias and neck pain.  Skin:  Negative for itching and rash.  Neurological:  Negative for dizziness, tingling, tremors, sensory change, speech change, focal weakness, seizures, loss of consciousness, weakness and headaches.  Endo/Heme/Allergies:  Negative for environmental allergies and polydipsia. Does not bruise/bleed easily.  Psychiatric/Behavioral:  Negative for depression, hallucinations, memory loss, substance abuse and suicidal ideas. The patient is not nervous/anxious and does not have insomnia.   All other systems reviewed and are negative.    Physical Exam Vitals and nursing note reviewed.  Constitutional:      General: She is not in acute distress.    Appearance: Normal appearance. She is obese. She is not ill-appearing, toxic-appearing or diaphoretic.  HENT:     Head: Normocephalic and atraumatic.     Right Ear: Tympanic membrane, ear canal and external ear normal.     Left Ear: Tympanic membrane, ear canal and external ear normal.     Nose: Nose normal.     Mouth/Throat:     Mouth: Mucous membranes are moist.     Pharynx: Oropharynx is clear.  Eyes:     General: Lids are normal.     Conjunctiva/sclera: Conjunctivae normal.     Pupils: Pupils are equal, round, and reactive to light.  Neck:     Vascular: No carotid bruit.     Trachea: Trachea and phonation normal.  Cardiovascular:     Rate and Rhythm: Normal rate and  regular rhythm.     Pulses: Normal pulses.     Heart sounds: Normal heart sounds. No murmur heard.    No friction rub. No gallop.  Pulmonary:     Effort: Pulmonary effort is normal.     Breath sounds: Normal breath sounds.  Abdominal:     General: Abdomen is protuberant. Bowel sounds are normal. There is no distension.     Palpations: Abdomen is soft.     Tenderness: There is no abdominal tenderness.  Genitourinary:    Comments: Deferred Musculoskeletal:       Arms:     Cervical back: Normal range of motion and neck supple. No rigidity or tenderness.  Lymphadenopathy:     Cervical: No cervical adenopathy.     Upper Body:     Right upper body: No supraclavicular adenopathy.     Left upper body: No supraclavicular adenopathy.  Skin:    General: Skin is warm and dry.     Capillary Refill: Capillary refill takes less than 2 seconds.  Neurological:     General: No focal deficit present.     Mental Status: She is alert and oriented to person, place, and time.     Cranial Nerves: No cranial nerve deficit.     Sensory: No sensory deficit.     Motor: No weakness.     Coordination: Coordination normal.     Gait: Gait normal.     Deep Tendon Reflexes: Reflexes normal.  Psychiatric:        Mood and Affect: Mood normal.        Behavior: Behavior is cooperative.        Thought Content: Thought content normal.        Judgment: Judgment normal.       Assessment/ Plan: Atha Starks here for annual physical exam.   Kaydin was seen today for annual exam.  Diagnoses and all orders for this visit:  Annual physical exam Health maintenance discussed in detail. Diet and exercise encouraged.  -     CBC with Differential/Platelet -     CMP14+EGFR -     VITAMIN D 25 Hydroxy (Vit-D Deficiency, Fractures) -     Thyroid Panel With TSH -     Lipid panel -     MM 3D SCREEN BREAST BILATERAL  Encounter for screening mammogram for malignant neoplasm of breast -     MM 3D SCREEN BREAST  BILATERAL  Essential hypertension Has a dry cough which seems to be worsening. Likely from lisinopril, will switch to below. Monitor readings and report high or low readings. Follow up for reevaluation in 6 months, sooner if still symptomatic -     losartan-hydrochlorothiazide (HYZAAR) 50-12.5 MG tablet; Take 1 tablet by mouth daily. -     CBC with Differential/Platelet -     CMP14+EGFR -     Thyroid Panel With TSH -     Lipid panel  Vitamin D deficiency Labs pending. Continue repletion therapy. If indicated, will change repletion dosage. Eat foods rich in Vit D including milk, orange juice, yogurt with vitamin D added, salmon or mackerel, canned tuna fish, cereals with vitamin D added, and cod liver oil. Get out in the sun but make sure to wear at least SPF 30 sunscreen.  -     VITAMIN D 25 Hydroxy (Vit-D Deficiency, Fractures)  Mixed hyperlipidemia Diet encouraged - increase intake of fresh fruits and vegetables, increase intake of lean proteins. Bake, broil, or grill foods. Avoid fried, greasy, and fatty foods. Avoid fast foods. Increase intake of fiber-rich whole grains. Exercise encouraged - at least 150 minutes per week and advance as tolerated. Goal BMI < 25. Continue medications as prescribed. Follow up in 3-6 months as discussed.  -     Lipid panel  BMI 32.0-32.9,adult Diet and exercise encouraged. Labs pending.  -     CBC with Differential/Platelet -     CMP14+EGFR -     VITAMIN D 25 Hydroxy (Vit-D Deficiency, Fractures) -     Thyroid Panel With TSH -     Lipid panel    Counseled on healthy lifestyle choices, including diet (rich in fruits, vegetables and lean meats and low in salt and simple carbohydrates) and exercise (at least 30 minutes of moderate physical activity daily).  Patient to follow up in 1 year for annual exam or sooner if needed.  The  above assessment and management plan was discussed with the patient. The patient verbalized understanding of and has agreed to  the management plan. Patient is aware to call the clinic if symptoms persist or worsen. Patient is aware when to return to the clinic for a follow-up visit. Patient educated on when it is appropriate to go to the emergency department.   Monia Pouch, FNP-C [facility] [address] [place], [place] [address] [phone]

## 2022-02-01 LAB — CBC WITH DIFFERENTIAL/PLATELET
Basophils Absolute: 0 10*3/uL (ref 0.0–0.2)
Basos: 1 %
EOS (ABSOLUTE): 0.1 10*3/uL (ref 0.0–0.4)
Eos: 2 %
Hematocrit: 43.2 % (ref 34.0–46.6)
Hemoglobin: 14.6 g/dL (ref 11.1–15.9)
Immature Grans (Abs): 0 10*3/uL (ref 0.0–0.1)
Immature Granulocytes: 0 %
Lymphocytes Absolute: 1.9 10*3/uL (ref 0.7–3.1)
Lymphs: 37 %
MCH: 31.3 pg (ref 26.6–33.0)
MCHC: 33.8 g/dL (ref 31.5–35.7)
MCV: 93 fL (ref 79–97)
Monocytes Absolute: 0.4 10*3/uL (ref 0.1–0.9)
Monocytes: 8 %
Neutrophils Absolute: 2.6 10*3/uL (ref 1.4–7.0)
Neutrophils: 52 %
Platelets: 249 10*3/uL (ref 150–450)
RBC: 4.67 x10E6/uL (ref 3.77–5.28)
RDW: 12.5 % (ref 11.7–15.4)
WBC: 5 10*3/uL (ref 3.4–10.8)

## 2022-02-01 LAB — THYROID PANEL WITH TSH
Free Thyroxine Index: 1.8 (ref 1.2–4.9)
T3 Uptake Ratio: 24 % (ref 24–39)
T4, Total: 7.6 ug/dL (ref 4.5–12.0)
TSH: 1.03 u[IU]/mL (ref 0.450–4.500)

## 2022-02-01 LAB — CMP14+EGFR
ALT: 19 IU/L (ref 0–32)
AST: 19 IU/L (ref 0–40)
Albumin/Globulin Ratio: 1.6 (ref 1.2–2.2)
Albumin: 4.5 g/dL (ref 3.9–4.9)
Alkaline Phosphatase: 53 IU/L (ref 44–121)
BUN/Creatinine Ratio: 17 (ref 12–28)
BUN: 14 mg/dL (ref 8–27)
Bilirubin Total: 0.5 mg/dL (ref 0.0–1.2)
CO2: 30 mmol/L — ABNORMAL HIGH (ref 20–29)
Calcium: 10.1 mg/dL (ref 8.7–10.3)
Chloride: 98 mmol/L (ref 96–106)
Creatinine, Ser: 0.83 mg/dL (ref 0.57–1.00)
Globulin, Total: 2.9 g/dL (ref 1.5–4.5)
Glucose: 107 mg/dL — ABNORMAL HIGH (ref 70–99)
Potassium: 4 mmol/L (ref 3.5–5.2)
Sodium: 142 mmol/L (ref 134–144)
Total Protein: 7.4 g/dL (ref 6.0–8.5)
eGFR: 80 mL/min/{1.73_m2} (ref >59)

## 2022-02-01 LAB — LIPID PANEL
Chol/HDL Ratio: 2.7 ratio (ref 0.0–4.4)
Cholesterol, Total: 166 mg/dL (ref 100–199)
HDL: 62 mg/dL (ref >39)
LDL Chol Calc (NIH): 85 mg/dL (ref 0–99)
Triglycerides: 103 mg/dL (ref 0–149)
VLDL Cholesterol Cal: 19 mg/dL (ref 5–40)

## 2022-02-01 LAB — VITAMIN D 25 HYDROXY (VIT D DEFICIENCY, FRACTURES): Vit D, 25-Hydroxy: 50.4 ng/mL (ref 30.0–100.0)

## 2022-02-02 ENCOUNTER — Other Ambulatory Visit: Payer: Self-pay | Admitting: Family Medicine

## 2022-02-02 DIAGNOSIS — I10 Essential (primary) hypertension: Secondary | ICD-10-CM

## 2022-02-03 NOTE — Telephone Encounter (Signed)
HYZAAR 50-12.5 MG tablet        Changed from: losartan-hydrochlorothiazide (HYZAAR) 50-12.5 MG tablet   Pharmacy comment: Concurrent use of losartan/hctz and lisinopril/hctz may cause additive toxicity including an increased risk of hyperkalemia in some patients. Should PT remain on both? Please respond with appropriate changes or comment to Pharmacy.  Concurrent use of losartan/hctz and lisinopril/hctz may cause additive toxicity including an increased risk of hyperkalemia in some patients. Should PT remain on both? Please respond with appropriate changes or.

## 2022-02-06 NOTE — Telephone Encounter (Signed)
TC to [facility] to clarify medication w/ pharmacy tech Verbal Rx given for Losartan HCTZ 50-12.5 1 QD #90 w/ 3 RFs with note put in their system Lisinopril-HCTZ DCd d/t cough Also added Lisinopril-HCTZ to our allergies/contraindications w/ low severity and reaction of cough

## 2022-02-25 ENCOUNTER — Encounter: Payer: Self-pay | Admitting: Nurse Practitioner

## 2022-02-25 ENCOUNTER — Ambulatory Visit (INDEPENDENT_AMBULATORY_CARE_PROVIDER_SITE_OTHER): Payer: BC Managed Care – PPO | Admitting: Nurse Practitioner

## 2022-02-25 DIAGNOSIS — R051 Acute cough: Secondary | ICD-10-CM | POA: Diagnosis not present

## 2022-02-25 MED ORDER — BENZONATATE 100 MG PO CAPS: 100.0000 mg | ORAL_CAPSULE | Freq: Three times a day (TID) | ORAL | 0 refills | Status: DC | PRN

## 2022-02-25 MED ORDER — DM-GUAIFENESIN ER 30-600 MG PO TB12: 1.0000 | ORAL_TABLET | Freq: Two times a day (BID) | ORAL | 0 refills | Status: DC

## 2022-02-25 NOTE — Progress Notes (Signed)
   Virtual Visit  Note Due to COVID-19 pandemic this visit was conducted virtually. This visit type was conducted due to national recommendations for restrictions regarding the COVID-19 Pandemic (e.g. social distancing, sheltering in place) in an effort to limit this patient's exposure and mitigate transmission in our community. All issues noted in this document were discussed and addressed.  A physical exam was not performed with this format.  I connected with CLARIE Pearson on 02/25/22 at 12:50 pm  by telephone and verified that I am speaking with the correct person using two identifiers. Katherine Pearson is currently located at home  during visit. The provider, Ivy Lynn, NP is located in their office at time of visit.  I discussed the limitations, risks, security and privacy concerns of performing an evaluation and management service by telephone and the availability of in person appointments. I also discussed with the patient that there may be a patient responsible charge related to this service. The patient expressed understanding and agreed to proceed.   History and Present Illness:  Cough This is a new problem. The current episode started yesterday. The problem has been unchanged. The problem occurs constantly. The cough is Productive of sputum. Pertinent negatives include no chest pain, chills, fever, nasal congestion, rash or sore throat. Nothing aggravates the symptoms.      Review of Systems  Constitutional: Negative.  Negative for chills and fever.  HENT:  Negative for sore throat.   Eyes: Negative.   Respiratory:  Positive for cough.   Cardiovascular: Negative.  Negative for chest pain.  Skin: Negative.  Negative for rash.  All other systems reviewed and are negative.    Observations/Objective: Televisit patient not in distress.  Assessment and Plan: Patient presents with acute cough with no fever body aches or nausea. Take meds as prescribed - Use a cool mist  humidifier  -Use saline nose sprays frequently -Force fluids -Benzonate for cough, Guaifenesin for cough and congestion -For fever or aches or pains- take Tylenol or ibuprofen. -If symptoms do not improve, she may need to be COVID tested to rule this out Follow up with worsening unresolved symptoms   Follow Up Instructions: Follow-up for worsening hours of symptoms.    I discussed the assessment and treatment plan with the patient. The patient was provided an opportunity to ask questions and all were answered. The patient agreed with the plan and demonstrated an understanding of the instructions.   The patient was advised to call back or seek an in-person evaluation if the symptoms worsen or if the condition fails to improve as anticipated.  The above assessment and management plan was discussed with the patient. The patient verbalized understanding of and has agreed to the management plan. Patient is aware to call the clinic if symptoms persist or worsen. Patient is aware when to return to the clinic for a follow-up visit. Patient educated on when it is appropriate to go to the emergency department.   Time call ended:  11:59  I provided 9 minutes of  non face-to-face time during this encounter.    Ivy Lynn, NP

## 2022-02-27 ENCOUNTER — Ambulatory Visit (INDEPENDENT_AMBULATORY_CARE_PROVIDER_SITE_OTHER): Payer: BC Managed Care – PPO | Admitting: Family Medicine

## 2022-02-27 ENCOUNTER — Encounter: Payer: Self-pay | Admitting: Family Medicine

## 2022-02-27 DIAGNOSIS — J0191 Acute recurrent sinusitis, unspecified: Secondary | ICD-10-CM | POA: Diagnosis not present

## 2022-02-27 MED ORDER — AMOXICILLIN-POT CLAVULANATE 875-125 MG PO TABS
1.0000 | ORAL_TABLET | Freq: Two times a day (BID) | ORAL | 0 refills | Status: AC
Start: 2022-02-27 — End: 2022-03-06

## 2022-02-27 NOTE — Progress Notes (Signed)
Virtual Visit  Note Due to COVID-19 pandemic this visit was conducted virtually. This visit type was conducted due to national recommendations for restrictions regarding the COVID-19 Pandemic (e.g. social distancing, sheltering in place) in an effort to limit this patient's exposure and mitigate transmission in our community. All issues noted in this document were discussed and addressed.  A physical exam was not performed with this format.  I connected with Katherine Pearson on 02/27/22 at 1346 by telephone and verified that I am speaking with the correct person using two identifiers. Katherine Pearson is currently located in her car and no one is currently with her during the visit. The provider, Gwenlyn Perking, FNP is located in their office at time of visit.  I discussed the limitations, risks, security and privacy concerns of performing an evaluation and management service by telephone and the availability of in person appointments. I also discussed with the patient that there may be a patient responsible charge related to this service. The patient expressed understanding and agreed to proceed.  CC: cough  History and Present Illness:  Cough Episode onset: 1 week. The cough is Productive of sputum. Associated symptoms include headaches (pressure, tenderness), nasal congestion and postnasal drip. Pertinent negatives include no chest pain, chills, ear congestion, ear pain, fever, heartburn, hemoptysis, myalgias, rash, rhinorrhea, sore throat, shortness of breath, sweats, weight loss or wheezing. Associated symptoms comments: Also reports change in smell and taste today. She has tried prescription cough suppressant (mucinex, tessalon perles, nasonex) for the symptoms. The treatment provided mild relief. There is no history of asthma or COPD. recurrent sinusitis      Review of Systems  Constitutional:  Negative for chills, fever and weight loss.  HENT:  Positive for postnasal drip. Negative for  ear pain, rhinorrhea and sore throat.   Respiratory:  Positive for cough. Negative for hemoptysis, shortness of breath and wheezing.   Cardiovascular:  Negative for chest pain.  Gastrointestinal:  Negative for heartburn.  Musculoskeletal:  Negative for myalgias.  Skin:  Negative for rash.  Neurological:  Positive for headaches (pressure, tenderness).     Observations/Objective: Alert and oriented x 3. Able to speak in full sentences without difficulty.    Assessment and Plan: Diagnoses and all orders for this visit:  Acute recurrent sinusitis, unspecified location Symptoms x 1 week without improvement. Covid test pending as she now also reports change in taste, smell. Quarantine until results. Augmentin as below. Continue mucinex, nasonex, and tesslon perles. Return to office for new or worsening symptoms, or if symptoms persist.  -     Novel Coronavirus, NAA ([facility]) -     amoxicillin-clavulanate (AUGMENTIN) 875-125 MG tablet; Take 1 tablet by mouth 2 (two) times daily for 7 days.     Follow Up Instructions: As needed.     I discussed the assessment and treatment plan with the patient. The patient was provided an opportunity to ask questions and all were answered. The patient agreed with the plan and demonstrated an understanding of the instructions.   The patient was advised to call back or seek an in-person evaluation if the symptoms worsen or if the condition fails to improve as anticipated.  The above assessment and management plan was discussed with the patient. The patient verbalized understanding of and has agreed to the management plan. Patient is aware to call the clinic if symptoms persist or worsen. Patient is aware when to return to the clinic for a follow-up visit. Patient educated on  when it is appropriate to go to the emergency department.   Time call ended:  1357  I provided 11 minutes of  non face-to-face time during this encounter.    Gwenlyn Perking,  FNP

## 2022-02-28 LAB — NOVEL CORONAVIRUS, NAA: SARS-CoV-2, NAA: DETECTED — AB

## 2022-03-10 ENCOUNTER — Encounter: Payer: Self-pay | Admitting: Nurse Practitioner

## 2022-03-10 ENCOUNTER — Telehealth: Payer: BC Managed Care – PPO | Admitting: Nurse Practitioner

## 2022-03-10 DIAGNOSIS — U071 COVID-19: Secondary | ICD-10-CM | POA: Diagnosis not present

## 2022-03-10 NOTE — Progress Notes (Signed)
Virtual Visit Consent   Katherine Pearson, you are scheduled for a virtual visit with Mary-Margaret Hassell Done, [place], a [facility] provider, today.     Just as with appointments in the office, your consent must be obtained to participate.  Your consent will be active for this visit and any virtual visit you may have with one of our providers in the next 365 days.     If you have a MyChart account, a copy of this consent can be sent to you electronically.  All virtual visits are billed to your insurance company just like a traditional visit in the office.    As this is a virtual visit, video technology does not allow for your provider to perform a traditional examination.  This may limit your provider's ability to fully assess your condition.  If your provider identifies any concerns that need to be evaluated in person or the need to arrange testing (such as labs, EKG, etc.), we will make arrangements to do so.     Although advances in technology are sophisticated, we cannot ensure that it will always work on either your end or our end.  If the connection with a video visit is poor, the visit may have to be switched to a telephone visit.  With either a video or telephone visit, we are not always able to ensure that we have a secure connection.     I need to obtain your verbal consent now.   Are you willing to proceed with your visit today? YES   Katherine Pearson has provided verbal consent on 03/10/2022 for a virtual visit (video or telephone).   Mary-Margaret Hassell Done, FNP   Date: 03/10/2022 1:41 PM   Virtual Visit via Video Note   I, Mary-Margaret Hassell Done, connected with Katherine Pearson ([id number], 1960/09/11) on 03/10/22 at  4:45 PM EDT by a video-enabled telemedicine application and verified that I am speaking with the correct person using two identifiers.  Location: Patient: Virtual Visit Location Patient: Home Provider: Virtual Visit Location Provider: Mobile   I discussed the limitations of  evaluation and management by telemedicine and the availability of in person appointments. The patient expressed understanding and agreed to proceed.    History of Present Illness: Katherine Pearson is a 61 y.o. who identifies as a female who was assigned female at birth, and is being seen today for covid positive.  HPI: URI  This is a new problem. The current episode started 1 to 4 weeks ago. There has been no fever. Associated symptoms include congestion, coughing, headaches, rhinorrhea and a sore throat. She has tried acetaminophen for the symptoms. The treatment provided mild relief.   Tested positive for covid over a week ago. Is still coughing and congested but is much better has to have a negative covid test in order to go back  to work.  Review of Systems  HENT:  Positive for congestion, rhinorrhea and sore throat.   Respiratory:  Positive for cough.   Neurological:  Positive for headaches.    Problems:  Patient Active Problem List   Diagnosis Date Noted   BMI 32.0-32.9,adult 01/31/2022   Maxillary sinusitis 06/12/2021   Nail abnormality 05/04/2019   Mixed hyperlipidemia 02/01/2019   Essential hypertension    Vitamin D deficiency 60/73/7106   Monoallelic mutation of CHEK2 gene in female patient 04/15/2018   Right thyroid nodule 08/10/2017   Hereditary disease 11/29/2015   History of primary malignant neoplasm of skin 10/01/2011  Mitral valve disorder 09/05/2010    Allergies:  Allergies  Allergen Reactions   Cefuroxime Axetil Rash   Lisinopril-Hydrochlorothiazide Cough   Medications:  Current Outpatient Medications:    acetaminophen (TYLENOL) 500 MG tablet, Take 500-1,000 mg by mouth every 6 (six) hours as needed (for pain/headaches.)., Disp: , Rfl:    atorvastatin (LIPITOR) 20 MG tablet, Take 1 tablet (20 mg total) by mouth daily., Disp: 90 tablet, Rfl: 3   benzonatate (TESSALON PERLES) 100 MG capsule, Take 1 capsule (100 mg total) by mouth 3 (three) times daily as  needed., Disp: 20 capsule, Rfl: 0   Bilberry 100 MG CAPS, Take 100 mg by mouth daily. , Disp: , Rfl:    Calcium Carb-Cholecalciferol (CALCIUM 600+D) 600-800 MG-UNIT TABS, Take 1 tablet by mouth daily., Disp: , Rfl:    dextromethorphan-guaiFENesin (MUCINEX DM) 30-600 MG 12hr tablet, Take 1 tablet by mouth 2 (two) times daily., Disp: 30 tablet, Rfl: 0   losartan-hydrochlorothiazide (HYZAAR) 50-12.5 MG tablet, Take 1 tablet by mouth daily., Disp: 90 tablet, Rfl: 3   mometasone (NASONEX) 50 MCG/ACT nasal spray, Place 2 sprays into the nose daily., Disp: 17 g, Rfl: 12   Multiple Vitamin (MULTIVITAMIN WITH MINERALS) TABS tablet, Take 1 tablet by mouth daily., Disp: , Rfl:    vitamin C (ASCORBIC ACID) 500 MG tablet, Take 500 mg by mouth every 14 (fourteen) days. Chewable, Disp: , Rfl:   Observations/Objective: Patient is well-developed, well-nourished in no acute distress.  Resting comfortably  at home.  Head is normocephalic, atraumatic.  No labored breathing.  Speech is clear and coherent with logical content.  Patient is alert and oriented at baseline.  Raspy voice Dry cough  Assessment and Plan:  Katherine Pearson in today with chief complaint of Covid Positive   1. Positive self-administered antigen test for COVID-19 Patient will come in for covid tetsing 1. Take meds as prescribed 2. Use a cool mist humidifier especially during the winter months and when heat has been humid. 3. Use saline nose sprays frequently 4. Saline irrigations of the nose can be very helpful if done frequently.  * 4X daily for 1 week*  * Use of a nettie pot can be helpful with this. Follow directions with this* 5. Drink plenty of fluids 6. Keep thermostat turn down low 7.For any cough or congestion- OTC as needed 8. For fever or aces or pains- take tylenol or ibuprofen appropriate for age and weight.  * for fevers greater than 101 orally you may alternate ibuprofen and tylenol every  3 hours.     Follow Up  Instructions: I discussed the assessment and treatment plan with the patient. The patient was provided an opportunity to ask questions and all were answered. The patient agreed with the plan and demonstrated an understanding of the instructions.  A copy of instructions were sent to the patient via MyChart.  The patient was advised to call back or seek an in-person evaluation if the symptoms worsen or if the condition fails to improve as anticipated.  Time:  I spent 8 minutes with the patient via telehealth technology discussing the above problems/concerns.    Mary-Margaret Hassell Done, FNP

## 2022-03-10 NOTE — Patient Instructions (Signed)
1. Take meds as prescribed 2. Use a cool mist humidifier especially during the winter months and when heat has been humid. 3. Use saline nose sprays frequently 4. Saline irrigations of the nose can be very helpful if done frequently.  * 4X daily for 1 week*  * Use of a nettie pot can be helpful with this. Follow directions with this* 5. Drink plenty of fluids 6. Keep thermostat turn down low 7.For any cough or congestion- OTC meds 8. For fever or aces or pains- take tylenol or ibuprofen appropriate for age and weight.  * for fevers greater than 101 orally you may alternate ibuprofen and tylenol every  3 hours.

## 2022-03-11 LAB — NOVEL CORONAVIRUS, NAA: SARS-CoV-2, NAA: NOT DETECTED

## 2022-03-29 ENCOUNTER — Other Ambulatory Visit: Payer: Self-pay | Admitting: Family Medicine

## 2022-03-29 DIAGNOSIS — E782 Mixed hyperlipidemia: Secondary | ICD-10-CM

## 2022-06-18 ENCOUNTER — Ambulatory Visit
Admission: RE | Admit: 2022-06-18 | Discharge: 2022-06-18 | Disposition: A | Discharge status: Home / Self Care | Payer: BC Managed Care – PPO | Source: Ambulatory Visit | Attending: Family Medicine | Admitting: Family Medicine

## 2022-06-18 DIAGNOSIS — H2513 Age-related nuclear cataract, bilateral: Secondary | ICD-10-CM | POA: Diagnosis not present

## 2022-06-18 DIAGNOSIS — H50011 Monocular esotropia, right eye: Secondary | ICD-10-CM | POA: Diagnosis not present

## 2022-06-18 DIAGNOSIS — Z1231 Encounter for screening mammogram for malignant neoplasm of breast: Secondary | ICD-10-CM | POA: Diagnosis not present

## 2022-07-04 ENCOUNTER — Other Ambulatory Visit: Payer: Self-pay | Admitting: Family Medicine

## 2022-07-04 DIAGNOSIS — E782 Mixed hyperlipidemia: Secondary | ICD-10-CM

## 2022-08-06 ENCOUNTER — Encounter: Payer: Self-pay | Admitting: Family Medicine

## 2022-08-06 ENCOUNTER — Ambulatory Visit: Payer: BC Managed Care – PPO | Admitting: Family Medicine

## 2022-08-06 VITALS — BP 129/80 | HR 54 | Temp 98.5°F | Ht 64.0 in | Wt 194.4 lb

## 2022-08-06 DIAGNOSIS — E782 Mixed hyperlipidemia: Secondary | ICD-10-CM

## 2022-08-06 DIAGNOSIS — I10 Essential (primary) hypertension: Secondary | ICD-10-CM | POA: Diagnosis not present

## 2022-08-06 DIAGNOSIS — Z6833 Body mass index (BMI) 33.0-33.9, adult: Secondary | ICD-10-CM | POA: Diagnosis not present

## 2022-08-06 DIAGNOSIS — E559 Vitamin D deficiency, unspecified: Secondary | ICD-10-CM

## 2022-08-06 DIAGNOSIS — L0291 Cutaneous abscess, unspecified: Secondary | ICD-10-CM | POA: Diagnosis not present

## 2022-08-06 MED ORDER — DOXYCYCLINE HYCLATE 100 MG PO TABS: 100.0000 mg | ORAL_TABLET | Freq: Two times a day (BID) | ORAL | 0 refills | Status: AC

## 2022-08-06 MED ORDER — LOSARTAN POTASSIUM-HCTZ 50-12.5 MG PO TABS: 1.0000 | ORAL_TABLET | Freq: Every day | ORAL | 3 refills | Status: DC

## 2022-08-06 MED ORDER — ATORVASTATIN CALCIUM 20 MG PO TABS: 20.0000 mg | ORAL_TABLET | Freq: Every day | ORAL | 3 refills | Status: DC

## 2022-08-06 NOTE — Progress Notes (Signed)
Subjective:  Patient ID: Katherine Pearson, female    DOB: [birth year], 62 y.o.   MRN: [id number]  Patient Care Team: Baruch Gouty, FNP as PCP - General (Family Medicine) Danie Binder, MD (Inactive) as Consulting Physician (Gastroenterology)   Chief Complaint:  Hypertension (6 month follow up ), Groin Pain (Right groin pain x 1 1/2 weeks ), and vaginal bump (Patient states she has a bump in her vaginal area that has been there 1 1/2 weeks. )   HPI: Katherine Pearson is a 62 y.o. female presenting on 08/06/2022 for Hypertension (6 month follow up ), Groin Pain (Right groin pain x 1 1/2 weeks ), and vaginal bump (Patient states she has a bump in her vaginal area that has been there 1 1/2 weeks. )   1. Essential hypertension Complaint with meds - Yes Current Medications - Hyzaar Checking BP at home - yes, normal Exercising Regularly - No Watching Salt intake - Yes Pertinent ROS:  Headache - No Fatigue - No Visual Disturbances - No Chest pain - No Dyspnea - No Palpitations - No LE edema - No They report good compliance with medications and can restate their regimen by memory. No medication side effects.  BP Readings from Last 3 Encounters:  08/06/22 129/80  01/31/22 (!) 143/89  01/18/21 123/77    2. Vitamin D deficiency Pt is taking oral repletion therapy. Denies bone pain and tenderness, muscle weakness, fracture, and difficulty walking. Lab Results  Component Value Date   VD25OH 50.4 01/31/2022   VD25OH 53.4 01/18/2021   VD25OH 50.7 10/07/2019   Lab Results  Component Value Date   CALCIUM 10.1 01/31/2022     3. Mixed hyperlipidemia Compliant with medications - Yes Current medications - atorvastatin Side effects from medications - No Diet - generally healthy Exercise - no  4. BMI 33.0-33.9,adult Does try to follow a healthy diet but does not exercise on a regular basis. Has a desk job.   Pt also states she has a place on her vagina that she would like looked  at. Has been there for a few days. Was larger and has since decreased in size. Denies pain or drainage.     Relevant past medical, surgical, family, and social history reviewed and updated as indicated.  Allergies and medications reviewed and updated. Data reviewed: Chart in Epic.   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    Heart murmur    History of thyroid nodule    benign, left   Hyperlipidemia    Hypertension    Mitral valve prolapse    Vitamin D deficiency     Past Surgical History:  Procedure Laterality Date   BASAL CELL CARCINOMA EXCISION     COLONOSCOPY N/A 07/29/2019   Procedure: COLONOSCOPY;  Surgeon: Daneil Dolin, MD;  Location: [facility];  Service: Endoscopy;  Laterality: N/A;  10:30   POLYPECTOMY  07/29/2019   Procedure: POLYPECTOMY;  Surgeon: Daneil Dolin, MD;  Location: [facility];  Service: Endoscopy;;  recto-sigmoid    Social History   Socioeconomic History   Marital status: Married    Spouse name: Gwyndolyn Saxon   Number of children: 2   Years of education: Not on file   Highest education level: Not on file  Occupational History   Occupation: [employer]  Tobacco Use   Smoking status: Never  Smokeless tobacco: Never  Vaping Use   Vaping Use: Never used  Substance and Sexual Activity   Alcohol use: Yes    Comment: social   Drug use: Never   Sexual activity: Not on file  Other Topics Concern   Not on file  Social History Narrative   Not on file   Social Determinants of Health   Financial Resource Strain: Not on file  Food Insecurity: Not on file  Transportation Needs: Not on file  Physical Activity: Not on file  Stress: Not on file  Social Connections: Not on file  Intimate Partner Violence: Not on file    Outpatient Encounter Medications as of 08/06/2022  Medication Sig   acetaminophen (TYLENOL) 500 MG tablet Take 500-1,000 mg by mouth  every 6 (six) hours as needed (for pain/headaches.).   Bilberry 100 MG CAPS Take 100 mg by mouth daily.    Calcium Carb-Cholecalciferol (CALCIUM 600+D) 600-800 MG-UNIT TABS Take 1 tablet by mouth daily.   doxycycline (VIBRA-TABS) 100 MG tablet Take 1 tablet (100 mg total) by mouth 2 (two) times daily for 10 days. 1 po bid   mometasone (NASONEX) 50 MCG/ACT nasal spray Place 2 sprays into the nose daily.   Multiple Vitamin (MULTIVITAMIN WITH MINERALS) TABS tablet Take 1 tablet by mouth daily.   vitamin C (ASCORBIC ACID) 500 MG tablet Take 500 mg by mouth every 14 (fourteen) days. Chewable   [DISCONTINUED] atorvastatin (LIPITOR) 20 MG tablet TAKE 1 TABLET DAILY   [DISCONTINUED] losartan-hydrochlorothiazide (HYZAAR) 50-12.5 MG tablet Take 1 tablet by mouth daily.   atorvastatin (LIPITOR) 20 MG tablet Take 1 tablet (20 mg total) by mouth daily.   losartan-hydrochlorothiazide (HYZAAR) 50-12.5 MG tablet Take 1 tablet by mouth daily.   [DISCONTINUED] benzonatate (TESSALON PERLES) 100 MG capsule Take 1 capsule (100 mg total) by mouth 3 (three) times daily as needed.   [DISCONTINUED] dextromethorphan-guaiFENesin (MUCINEX DM) 30-600 MG 12hr tablet Take 1 tablet by mouth 2 (two) times daily.   No facility-administered encounter medications on file as of 08/06/2022.    Allergies  Allergen Reactions   Cefuroxime Axetil Rash   Lisinopril-Hydrochlorothiazide Cough    Review of Systems  Constitutional:  Negative for activity change, appetite change, chills, diaphoresis, fatigue, fever and unexpected weight change.  HENT: Negative.    Eyes: Negative.  Negative for photophobia and visual disturbance.  Respiratory:  Negative for cough, chest tightness and shortness of breath.   Cardiovascular:  Negative for chest pain, palpitations and leg swelling.  Gastrointestinal:  Negative for abdominal pain, blood in stool, constipation, diarrhea, nausea and vomiting.  Endocrine: Negative.  Negative for cold  intolerance, heat intolerance, polydipsia, polyphagia and polyuria.  Genitourinary:  Negative for decreased urine volume, difficulty urinating, dysuria, frequency and urgency.  Musculoskeletal:  Negative for arthralgias and myalgias.  Skin:  Positive for wound.  Allergic/Immunologic: Negative.   Neurological:  Negative for dizziness, tremors, seizures, syncope, facial asymmetry, speech difficulty, weakness, light-headedness, numbness and headaches.  Hematological: Negative.   Psychiatric/Behavioral:  Negative for confusion, hallucinations, sleep disturbance and suicidal ideas.   All other systems reviewed and are negative.       Objective:  BP 129/80   Pulse (!) 54   Temp 98.5 F (36.9 C) (Temporal)   Ht '5\' 4"'$  (1.626 m)   Wt 194 lb 6.4 oz (88.2 kg)   SpO2 99%   BMI 33.37 kg/m    Wt Readings from Last 3 Encounters:  08/06/22 194 lb 6.4 oz (88.2 kg)  01/31/22  190 lb (86.2 kg)  01/18/21 183 lb (83 kg)    Physical Exam Vitals and nursing note reviewed.  Constitutional:      General: She is not in acute distress.    Appearance: Normal appearance. She is well-developed and well-groomed. She is obese. She is not ill-appearing, toxic-appearing or diaphoretic.  HENT:     Head: Normocephalic and atraumatic.     Jaw: There is normal jaw occlusion.     Right Ear: Hearing, tympanic membrane, ear canal and external ear normal.     Left Ear: Hearing, tympanic membrane, ear canal and external ear normal.     Nose: Nose normal.     Mouth/Throat:     Lips: Pink.     Mouth: Mucous membranes are moist.     Pharynx: Oropharynx is clear. Uvula midline.  Eyes:     General: Lids are normal.     Extraocular Movements: Extraocular movements intact.     Conjunctiva/sclera: Conjunctivae normal.     Pupils: Pupils are equal, round, and reactive to light.  Neck:     Thyroid: No thyroid mass, thyromegaly or thyroid tenderness.     Vascular: No carotid bruit or JVD.     Trachea: Trachea and  phonation normal.  Cardiovascular:     Rate and Rhythm: Normal rate and regular rhythm.     Chest Wall: PMI is not displaced.     Pulses: Normal pulses.     Heart sounds: Normal heart sounds. No murmur heard.    No friction rub. No gallop.  Pulmonary:     Effort: Pulmonary effort is normal. No respiratory distress.     Breath sounds: Normal breath sounds. No wheezing.  Abdominal:     General: Bowel sounds are normal. There is no distension or abdominal bruit.     Palpations: Abdomen is soft. There is no hepatomegaly or splenomegaly.     Tenderness: There is no abdominal tenderness. There is no right CVA tenderness or left CVA tenderness.     Hernia: No hernia is present. There is no hernia in the left inguinal area or right inguinal area.  Genitourinary:    Exam position: Lithotomy position.     Pubic Area: No rash or pubic lice.      Tanner stage (genital): 5.    Musculoskeletal:        General: Normal range of motion.     Cervical back: Normal range of motion and neck supple.     Right lower leg: No edema.     Left lower leg: No edema.  Lymphadenopathy:     Cervical: No cervical adenopathy.     Lower Body: No right inguinal adenopathy. No left inguinal adenopathy.  Skin:    General: Skin is warm and dry.     Capillary Refill: Capillary refill takes less than 2 seconds.     Coloration: Skin is not cyanotic, jaundiced or pale.     Findings: No rash.  Neurological:     General: No focal deficit present.     Mental Status: She is alert and oriented to person, place, and time.     Sensory: Sensation is intact.     Motor: Motor function is intact.     Coordination: Coordination is intact.     Gait: Gait is intact.     Deep Tendon Reflexes: Reflexes are normal and symmetric.  Psychiatric:        Attention and Perception: Attention and perception normal.  Mood and Affect: Mood and affect normal.        Speech: Speech normal.        Behavior: Behavior normal. Behavior is  cooperative.        Thought Content: Thought content normal.        Cognition and Memory: Cognition and memory normal.        Judgment: Judgment normal.     Results for orders placed or performed in visit on 03/10/22  Novel Coronavirus, NAA ([facility])   Specimen: Nasopharyngeal(NP) swabs in vial transport medium  Result Value Ref Range   SARS-CoV-2, NAA Not Detected Not Detected       Pertinent labs & imaging results that were available during my care of the patient were reviewed by me and considered in my medical decision making.  Assessment & Plan:  Makynna was seen today for hypertension, groin pain and vaginal bump.  Diagnoses and all orders for this visit:  Essential hypertension BP well controlled. Changes were not made in regimen today. Goal BP is 130/80. Pt aware to report any persistent high or low readings. DASH diet and exercise encouraged. Exercise at least 150 minutes per week and increase as tolerated. Goal BMI > 25. Stress management encouraged. Avoid nicotine and tobacco product use. Avoid excessive alcohol and NSAID's. Avoid more than 2000 mg of sodium daily. Medications as prescribed. Follow up as scheduled.  -     CMP14+EGFR -     CBC with Differential/Platelet -     Lipid panel -     Thyroid Panel With TSH -     VITAMIN D 25 Hydroxy (Vit-D Deficiency, Fractures) -     losartan-hydrochlorothiazide (HYZAAR) 50-12.5 MG tablet; Take 1 tablet by mouth daily.  Vitamin D deficiency Labs pending. Continue repletion therapy. If indicated, will change repletion dosage. Eat foods rich in Vit D including milk, orange juice, yogurt with vitamin D added, salmon or mackerel, canned tuna fish, cereals with vitamin D added, and cod liver oil. Get out in the sun but make sure to wear at least SPF 30 sunscreen.  -     CMP14+EGFR -     VITAMIN D 25 Hydroxy (Vit-D Deficiency, Fractures)  Mixed hyperlipidemia Diet encouraged - increase intake of fresh fruits and vegetables, increase  intake of lean proteins. Bake, broil, or grill foods. Avoid fried, greasy, and fatty foods. Avoid fast foods. Increase intake of fiber-rich whole grains. Exercise encouraged - at least 150 minutes per week and advance as tolerated.  Goal BMI < 25. Continue medications as prescribed. Follow up in 3-6 months as discussed.  -     CMP14+EGFR -     Lipid panel -     atorvastatin (LIPITOR) 20 MG tablet; Take 1 tablet (20 mg total) by mouth daily.  BMI 33.0-33.9,adult Diet and exercise encouraged. Labs pending. -     CMP14+EGFR -     CBC with Differential/Platelet -     Lipid panel -     Thyroid Panel With TSH  Abscess Not fluctuant today so I&D not completed. Will treat with below. Symptomatic care discussed in detail. Report new, worsening, or persistent symptoms.  -     doxycycline (VIBRA-TABS) 100 MG tablet; Take 1 tablet (100 mg total) by mouth 2 (two) times daily for 10 days. 1 po bid     Continue all other maintenance medications.  Follow up plan: Return in about 6 months (around 02/04/2023), or if symptoms worsen or fail to improve, for CPE.  Continue healthy lifestyle choices, including diet (rich in fruits, vegetables, and lean proteins, and low in salt and simple carbohydrates) and exercise (at least 30 minutes of moderate physical activity daily).  Educational handout given for health maintenance  The above assessment and management plan was discussed with the patient. The patient verbalized understanding of and has agreed to the management plan. Patient is aware to call the clinic if they develop any new symptoms or if symptoms persist or worsen. Patient is aware when to return to the clinic for a follow-up visit. Patient educated on when it is appropriate to go to the emergency department.   Monia Pouch, FNP-[facility] [phone]

## 2022-08-07 LAB — CMP14+EGFR
ALT: 23 IU/L (ref 0–32)
AST: 23 IU/L (ref 0–40)
Albumin/Globulin Ratio: 1.6 (ref 1.2–2.2)
Albumin: 4.2 g/dL (ref 3.9–4.9)
Alkaline Phosphatase: 59 IU/L (ref 44–121)
BUN/Creatinine Ratio: 20 (ref 12–28)
BUN: 16 mg/dL (ref 8–27)
Bilirubin Total: 0.3 mg/dL (ref 0.0–1.2)
CO2: 27 mmol/L (ref 20–29)
Calcium: 9.5 mg/dL (ref 8.7–10.3)
Chloride: 100 mmol/L (ref 96–106)
Creatinine, Ser: 0.79 mg/dL (ref 0.57–1.00)
Globulin, Total: 2.6 g/dL (ref 1.5–4.5)
Glucose: 108 mg/dL — ABNORMAL HIGH (ref 70–99)
Potassium: 4.1 mmol/L (ref 3.5–5.2)
Sodium: 142 mmol/L (ref 134–144)
Total Protein: 6.8 g/dL (ref 6.0–8.5)
eGFR: 85 mL/min/{1.73_m2} (ref >59)

## 2022-08-07 LAB — CBC WITH DIFFERENTIAL/PLATELET
Basophils Absolute: 0 10*3/uL (ref 0.0–0.2)
Basos: 1 %
EOS (ABSOLUTE): 0.1 10*3/uL (ref 0.0–0.4)
Eos: 2 %
Hematocrit: 42.3 % (ref 34.0–46.6)
Hemoglobin: 14 g/dL (ref 11.1–15.9)
Immature Grans (Abs): 0 10*3/uL (ref 0.0–0.1)
Immature Granulocytes: 0 %
Lymphocytes Absolute: 1.9 10*3/uL (ref 0.7–3.1)
Lymphs: 35 %
MCH: 30.2 pg (ref 26.6–33.0)
MCHC: 33.1 g/dL (ref 31.5–35.7)
MCV: 91 fL (ref 79–97)
Monocytes Absolute: 0.5 10*3/uL (ref 0.1–0.9)
Monocytes: 9 %
Neutrophils Absolute: 2.9 10*3/uL (ref 1.4–7.0)
Neutrophils: 53 %
Platelets: 231 10*3/uL (ref 150–450)
RBC: 4.64 x10E6/uL (ref 3.77–5.28)
RDW: 12.3 % (ref 11.7–15.4)
WBC: 5.5 10*3/uL (ref 3.4–10.8)

## 2022-08-07 LAB — THYROID PANEL WITH TSH
Free Thyroxine Index: 2.2 (ref 1.2–4.9)
T3 Uptake Ratio: 26 % (ref 24–39)
T4, Total: 8.4 ug/dL (ref 4.5–12.0)
TSH: 1.71 u[IU]/mL (ref 0.450–4.500)

## 2022-08-07 LAB — LIPID PANEL
Chol/HDL Ratio: 2.6 ratio (ref 0.0–4.4)
Cholesterol, Total: 155 mg/dL (ref 100–199)
HDL: 59 mg/dL (ref >39)
LDL Chol Calc (NIH): 82 mg/dL (ref 0–99)
Triglycerides: 72 mg/dL (ref 0–149)
VLDL Cholesterol Cal: 14 mg/dL (ref 5–40)

## 2022-08-07 LAB — VITAMIN D 25 HYDROXY (VIT D DEFICIENCY, FRACTURES): Vit D, 25-Hydroxy: 49.3 ng/mL (ref 30.0–100.0)

## 2022-10-06 IMAGING — MG DIGITAL SCREENING BILAT W/ TOMO W/ CAD
8 series · 8 of 24 positions shown · non-contrast
Comparison: Previous exam(s).

CLINICAL DATA: Screening.

EXAM:
DIGITAL SCREENING BILATERAL MAMMOGRAM WITH TOMO AND CAD

[R CC synth-2D]
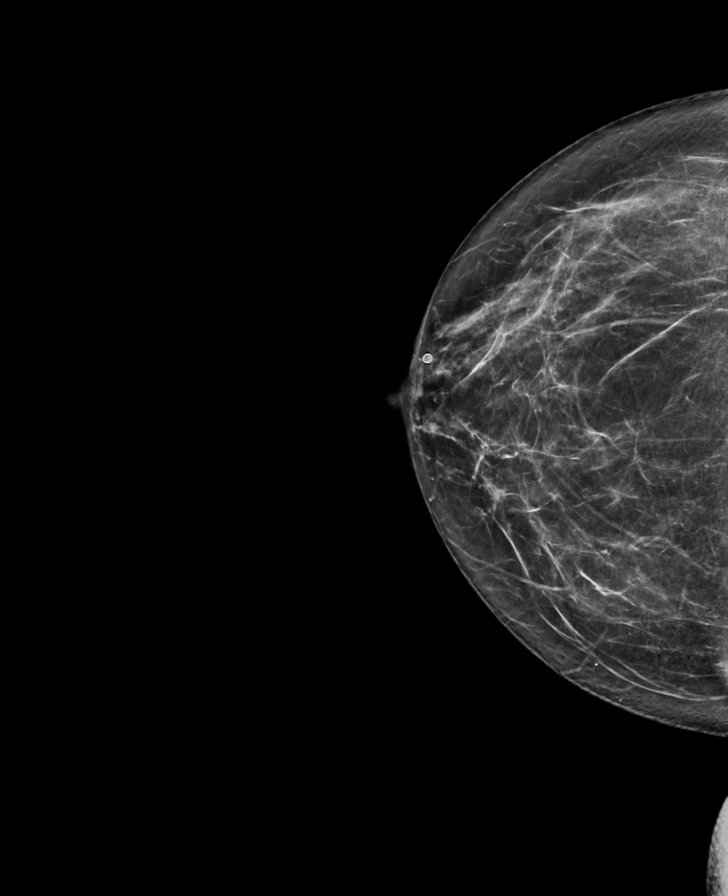

[L MLO synth-2D]
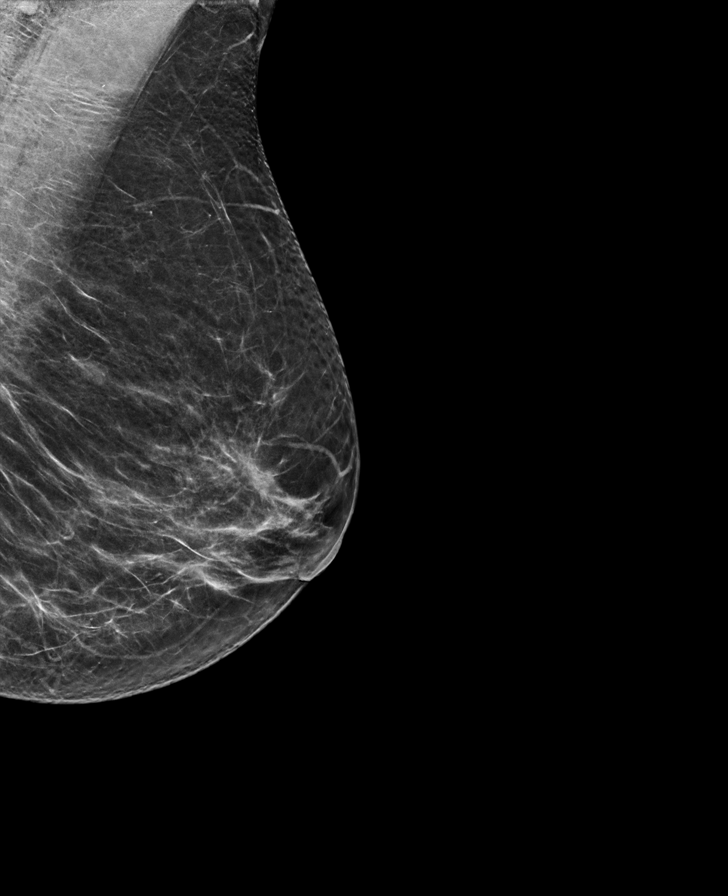

[L CC synth-2D]
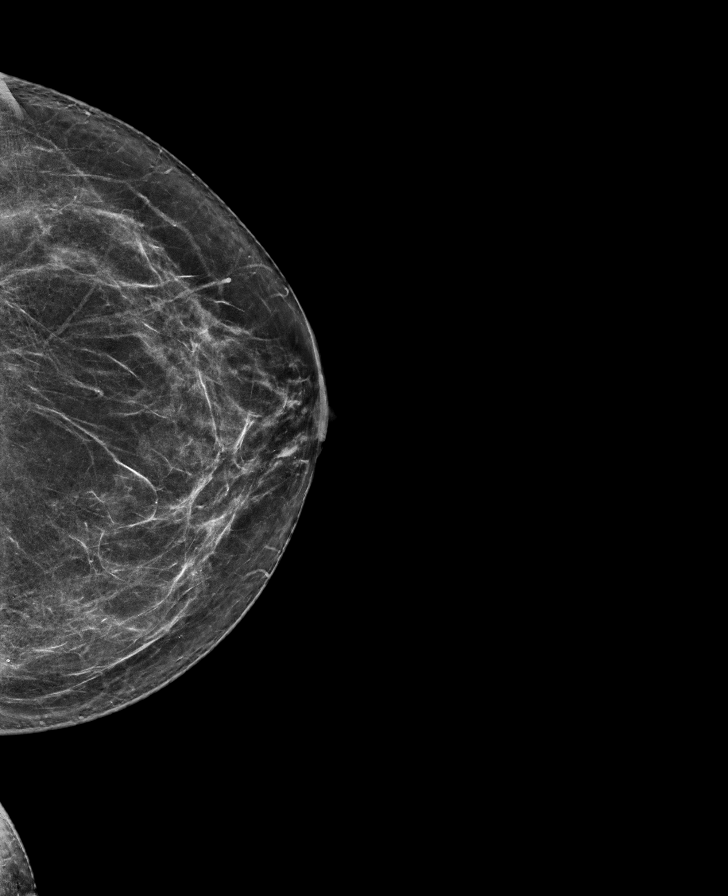

[R MLO synth-2D]
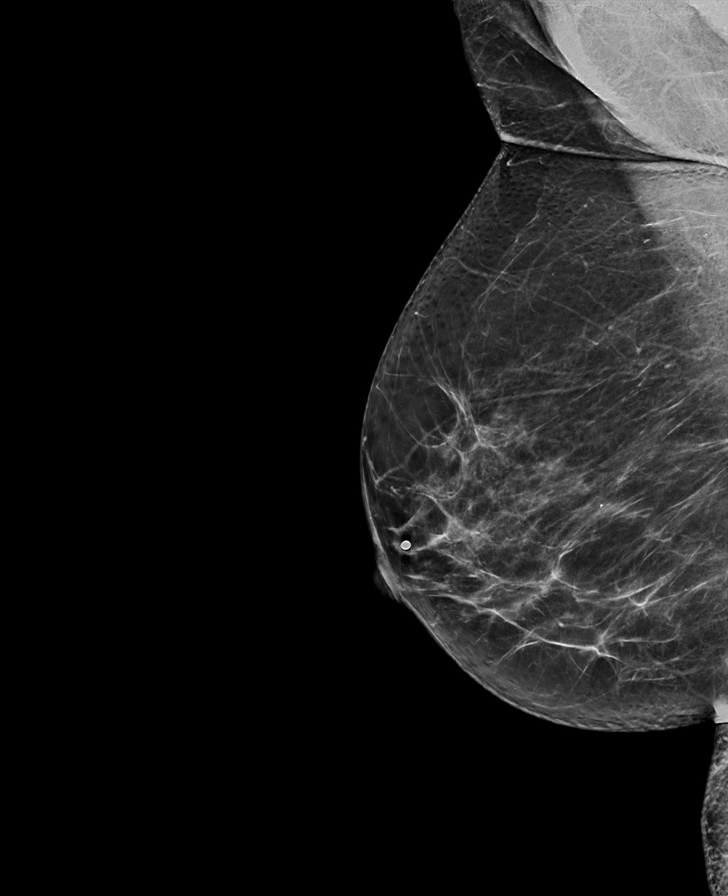

[R CC tomo · tomo slice 37/73.0]
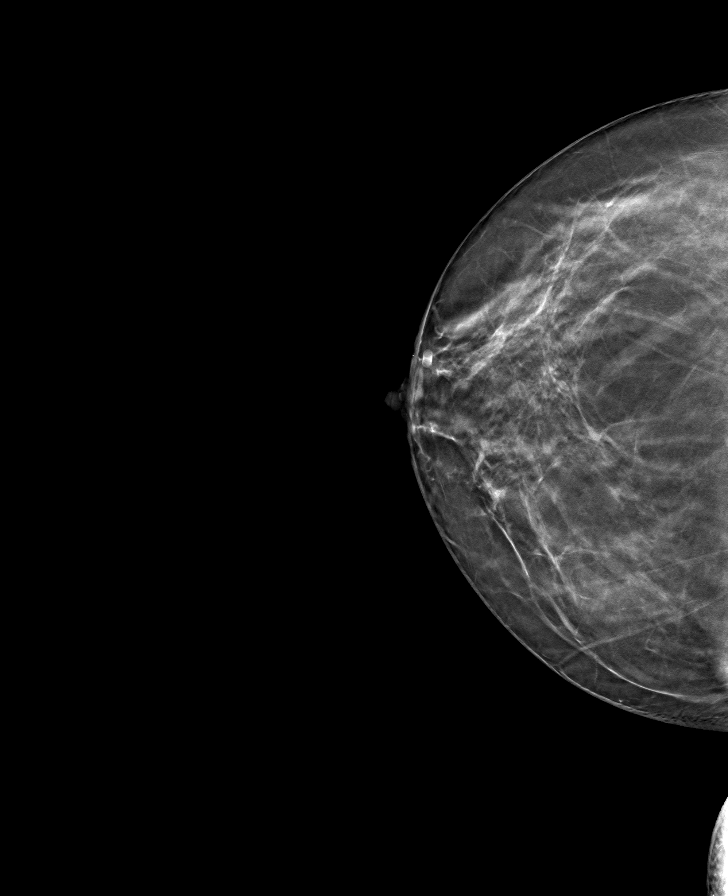

[R MLO tomo · tomo slice 37/73.0]
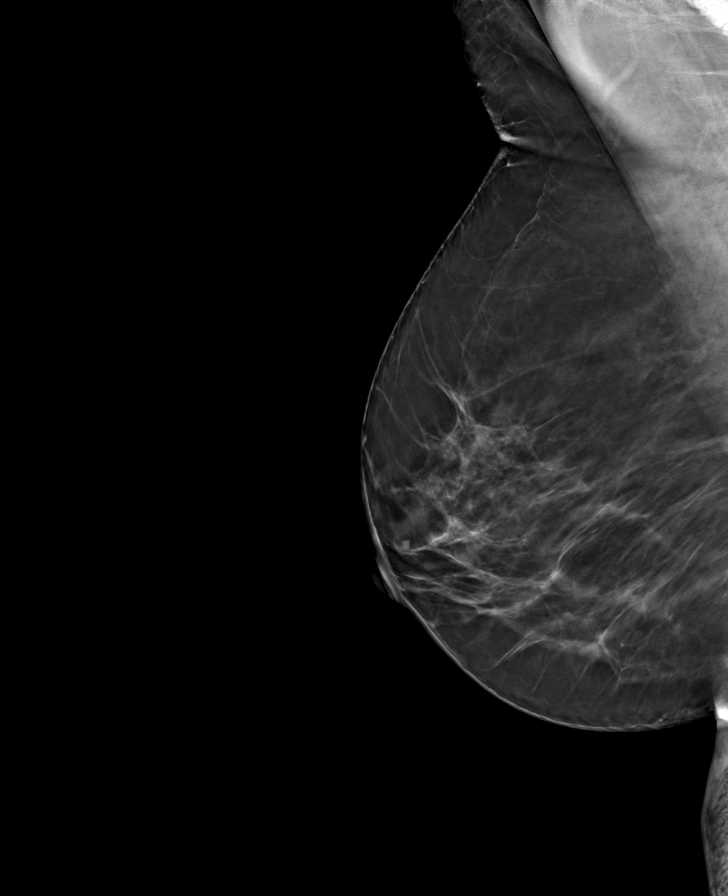

[L MLO tomo · tomo slice 35/68.0]
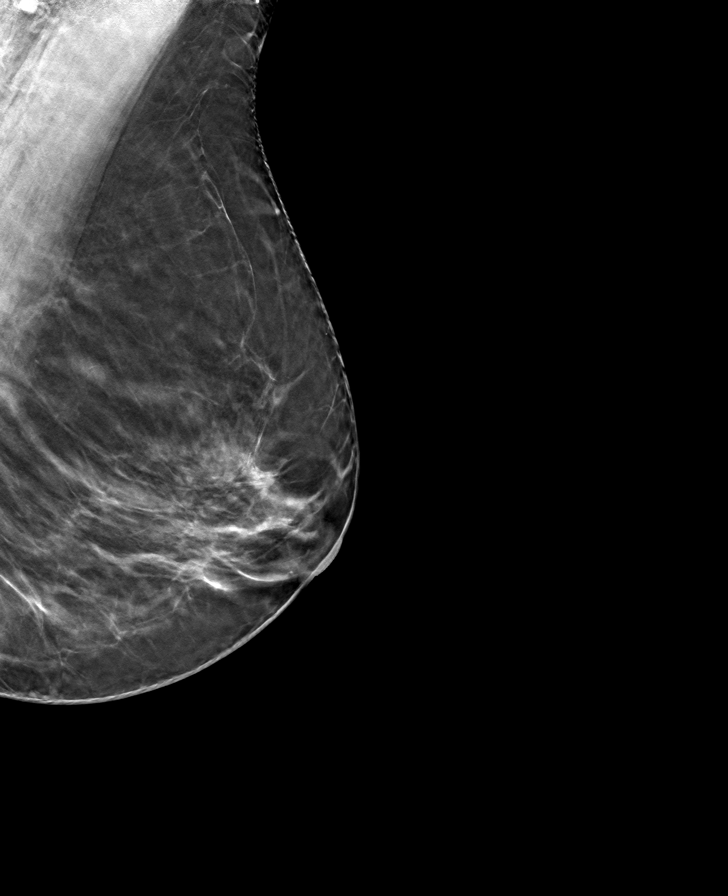

[L CC tomo · tomo slice 35/70.0]
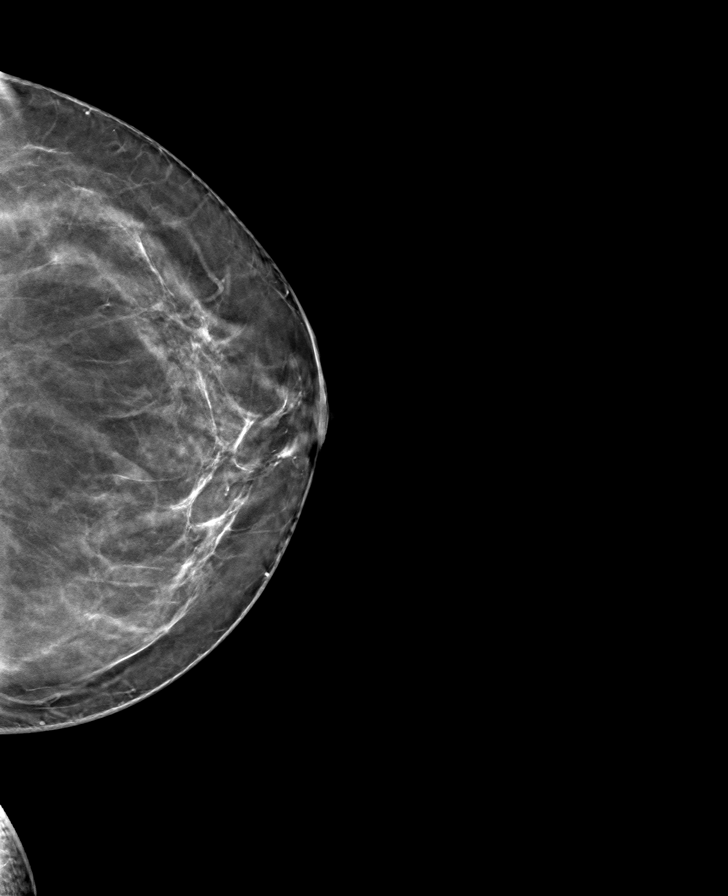

[8 of 24 positions shown; findings below may reference images not displayed]

ACR Breast Density Category b: There are scattered areas of
fibroglandular density.
FINDINGS: There are no findings suspicious for malignancy. Images were
processed with CAD.
IMPRESSION: No mammographic evidence of malignancy. A result letter of this
screening mammogram will be mailed directly to the patient.

RECOMMENDATION:
Screening mammogram in one year. (Code:[id number])

BI-RADS CATEGORY  1: Negative.

## 2023-02-20 ENCOUNTER — Ambulatory Visit (INDEPENDENT_AMBULATORY_CARE_PROVIDER_SITE_OTHER): Payer: BC Managed Care – PPO | Admitting: Family Medicine

## 2023-02-20 ENCOUNTER — Encounter: Payer: Self-pay | Admitting: Family Medicine

## 2023-02-20 VITALS — BP 134/79 | HR 80 | Temp 96.8°F | Resp 20 | Ht 63.0 in | Wt 190.1 lb

## 2023-02-20 DIAGNOSIS — Z0001 Encounter for general adult medical examination with abnormal findings: Secondary | ICD-10-CM

## 2023-02-20 DIAGNOSIS — Z13228 Encounter for screening for other metabolic disorders: Secondary | ICD-10-CM

## 2023-02-20 DIAGNOSIS — Z13220 Encounter for screening for lipoid disorders: Secondary | ICD-10-CM

## 2023-02-20 DIAGNOSIS — Z0000 Encounter for general adult medical examination without abnormal findings: Secondary | ICD-10-CM

## 2023-02-20 DIAGNOSIS — Z130 Encounter for screening for diseases of the blood and blood-forming organs and certain disorders involving the immune mechanism: Secondary | ICD-10-CM | POA: Diagnosis not present

## 2023-02-20 DIAGNOSIS — R7309 Other abnormal glucose: Secondary | ICD-10-CM | POA: Diagnosis not present

## 2023-02-20 DIAGNOSIS — Z1329 Encounter for screening for other suspected endocrine disorder: Secondary | ICD-10-CM

## 2023-02-20 DIAGNOSIS — Z1231 Encounter for screening mammogram for malignant neoplasm of breast: Secondary | ICD-10-CM | POA: Diagnosis not present

## 2023-02-20 LAB — ANEMIA PROFILE B
Basophils Absolute: 0.1 10*3/uL (ref 0.0–0.2)
Basos: 1 %
EOS (ABSOLUTE): 0.1 10*3/uL (ref 0.0–0.4)
Eos: 2 %
Hematocrit: 39.8 % (ref 34.0–46.6)
Hemoglobin: 13.7 g/dL (ref 11.1–15.9)
Immature Grans (Abs): 0 10*3/uL (ref 0.0–0.1)
Immature Granulocytes: 0 %
Lymphocytes Absolute: 1.8 10*3/uL (ref 0.7–3.1)
Lymphs: 36 %
MCH: 30.9 pg (ref 26.6–33.0)
MCHC: 34.4 g/dL (ref 31.5–35.7)
MCV: 90 fL (ref 79–97)
Monocytes Absolute: 0.4 10*3/uL (ref 0.1–0.9)
Monocytes: 8 %
Neutrophils Absolute: 2.6 10*3/uL (ref 1.4–7.0)
Neutrophils: 53 %
Platelets: 227 10*3/uL (ref 150–450)
RBC: 4.44 x10E6/uL (ref 3.77–5.28)
RDW: 12.2 % (ref 11.7–15.4)
Retic Ct Pct: 1.4 % (ref 0.6–2.6)
WBC: 5 10*3/uL (ref 3.4–10.8)

## 2023-02-20 LAB — LIPID PANEL
Chol/HDL Ratio: 2.8 ratio (ref 0.0–4.4)
Cholesterol, Total: 163 mg/dL (ref 100–199)
HDL: 58 mg/dL (ref >39)
LDL Chol Calc (NIH): 87 mg/dL (ref 0–99)
Triglycerides: 96 mg/dL (ref 0–149)
VLDL Cholesterol Cal: 18 mg/dL (ref 5–40)

## 2023-02-20 LAB — THYROID PANEL WITH TSH

## 2023-02-20 LAB — CMP14+EGFR
ALT: 16 IU/L (ref 0–32)
AST: 17 IU/L (ref 0–40)
Albumin: 4.3 g/dL (ref 3.9–4.9)
Alkaline Phosphatase: 54 IU/L (ref 44–121)
BUN/Creatinine Ratio: 19 (ref 12–28)
BUN: 15 mg/dL (ref 8–27)
Bilirubin Total: 0.4 mg/dL (ref 0.0–1.2)
CO2: 28 mmol/L (ref 20–29)
Calcium: 9.8 mg/dL (ref 8.7–10.3)
Chloride: 100 mmol/L (ref 96–106)
Creatinine, Ser: 0.81 mg/dL (ref 0.57–1.00)
Globulin, Total: 2.5 g/dL (ref 1.5–4.5)
Glucose: 117 mg/dL — ABNORMAL HIGH (ref 70–99)
Potassium: 3.6 mmol/L (ref 3.5–5.2)
Sodium: 143 mmol/L (ref 134–144)
Total Protein: 6.8 g/dL (ref 6.0–8.5)
eGFR: 82 mL/min/{1.73_m2} (ref >59)

## 2023-02-20 LAB — BAYER DCA HB A1C WAIVED: HB A1C (BAYER DCA - WAIVED): 5.6 % (ref 4.8–5.6)

## 2023-02-20 NOTE — Progress Notes (Signed)
Complete physical exam  Patient: Katherine Pearson   DOB: 10-Oct-1960   62 y.o. Female  MRN: [id number]  Subjective:    Chief Complaint  Patient presents with   Annual Exam    Katherine VELIC is a 62 y.o. female who presents today for a complete physical exam. She reports consuming a general diet. The patient does not participate in regular exercise at present. She generally feels well. She reports sleeping well. She does not have additional problems to discuss today.    Most recent fall risk assessment:    02/20/2023    8:13 AM  Fall Risk   Falls in the past year? 1  Number falls in past yr: 1  Injury with Fall? 0  Follow up Falls evaluation completed     Most recent depression screenings:    02/20/2023    8:14 AM 08/06/2022    8:12 AM  PHQ 2/9 Scores  PHQ - 2 Score 0 0  PHQ- 9 Score 1 1    Vision:Within last year and Dental: No current dental problems and Receives regular dental care  Patient Active Problem List   Diagnosis Date Noted   BMI 33.0-33.9,adult 01/31/2022   Maxillary sinusitis 06/12/2021   Nail abnormality 05/04/2019   Mixed hyperlipidemia 02/01/2019   Essential hypertension    Vitamin D deficiency 04/15/2018   Monoallelic mutation of CHEK2 gene in female patient 04/15/2018   Right thyroid nodule 08/10/2017   Hereditary disease 11/29/2015   BRCA2 gene mutation positive 11/28/2015   History of primary malignant neoplasm of skin 10/01/2011   Mitral valve disorder 09/05/2010   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    Heart murmur    History of thyroid nodule    benign, left   Hyperlipidemia    Hypertension    Mitral valve prolapse    Vitamin D deficiency    Past Surgical History:  Procedure Laterality Date   BASAL CELL CARCINOMA EXCISION     COLONOSCOPY N/A 07/29/2019   Procedure: COLONOSCOPY;  Surgeon: Corbin Ade, MD;  Location: [facility];   Service: Endoscopy;  Laterality: N/A;  10:30   POLYPECTOMY  07/29/2019   Procedure: POLYPECTOMY;  Surgeon: Corbin Ade, MD;  Location: [facility];  Service: Endoscopy;;  recto-sigmoid   Social History   Tobacco Use   Smoking status: Never   Smokeless tobacco: Never  Vaping Use   Vaping status: Never Used  Substance Use Topics   Alcohol use: Yes    Comment: social   Drug use: Never   Social History   Socioeconomic History   Marital status: Married    Spouse name: Chrissie Noa   Number of children: 2   Years of education: Not on file   Highest education level: Not on file  Occupational History   Occupation: [employer]  Tobacco Use   Smoking status: Never   Smokeless tobacco: Never  Vaping Use   Vaping status: Never Used  Substance and Sexual Activity   Alcohol use: Yes    Comment: social   Drug use: Never   Sexual activity: Not on file  Other Topics Concern   Not on file  Social History Narrative   Not on file   Social Determinants of Health   Financial Resource Strain: Not on file  Food Insecurity: Not on file  Transportation Needs: Not on file  Physical Activity: Not on file  Stress: Not on file  Social Connections: Unknown (11/25/2021)   Received from [facility], [facility]   Social Network    Social Network: Not on file  Intimate Partner Violence: Unknown (10/17/2021)   Received from [facility], [facility]   HITS    Physically Hurt: Not on file    Insult or Talk Down To: Not on file    Threaten Physical Harm: Not on file    Scream or Curse: Not on file   Family Status  Relation Name Status   Mother  Alive   Brother  Alive   MGM  Deceased   MGF  Deceased   PGM  Deceased   Father  Alive   Son old Alive   PGF  Deceased   Son Michna Alive  No partnership data on file   Family History  Problem Relation Age of Onset   Breast cancer Mother    Depression Mother    Hypertension Mother    Drug abuse Brother    Arthritis Maternal  Grandmother    Hypertension Maternal Grandmother    Gout Maternal Grandmother    Heart disease Maternal Grandmother    Diabetes Maternal Grandfather    Heart disease Maternal Grandfather    Obesity Maternal Grandfather    Aneurysm Paternal Grandmother    Heart murmur Father    Heart disease Paternal Grandfather    Heart defect Son    Allergies  Allergen Reactions   Cefuroxime Axetil Rash   Lisinopril-Hydrochlorothiazide Cough      Patient Care Team: Clio Gerhart, Doralee Albino, FNP as PCP - General (Family Medicine) [place] Bali, MD (Inactive) as Consulting Physician (Gastroenterology)   Outpatient Medications Prior to Visit  Medication Sig   acetaminophen (TYLENOL) 500 MG tablet Take 500-1,000 mg by mouth every 6 (six) hours as needed (for pain/headaches.).   atorvastatin (LIPITOR) 20 MG tablet Take 1 tablet (20 mg total) by mouth daily.   Bilberry 100 MG CAPS Take 100 mg by mouth daily.    cholecalciferol (VITAMIN D3) 25 MCG (1000 UNIT) tablet Take 1,000 Units by mouth daily.   losartan-hydrochlorothiazide (HYZAAR) 50-12.5 MG tablet Take 1 tablet by mouth daily.   Multiple Vitamin (MULTIVITAMIN WITH MINERALS) TABS tablet Take 1 tablet by mouth daily.   [DISCONTINUED] atorvastatin (LIPITOR) 20 MG tablet Take 1 tablet by mouth daily.   [DISCONTINUED] lisinopril-hydrochlorothiazide (ZESTORETIC) 20-25 MG tablet Take 1 tablet by mouth daily.   [DISCONTINUED] LUTEIN PO Take 1 tablet by mouth daily.   [DISCONTINUED] MANGANESE SULFATE IV Take 1 tablet by mouth daily.   Calcium Carb-Cholecalciferol (CALCIUM 600+D) 600-800 MG-UNIT TABS Take 1 tablet by mouth daily. (Patient not taking: Reported on 02/20/2023)   mometasone (NASONEX) 50 MCG/ACT nasal spray Place 2 sprays into the nose daily. (Patient not taking: Reported on 02/20/2023)   [DISCONTINUED] mometasone (NASONEX) 50 MCG/ACT nasal spray Place 2 sprays into the nose daily.   [DISCONTINUED] vitamin C (ASCORBIC ACID) 500 MG tablet Take 500 mg by  mouth every 14 (fourteen) days. Chewable   No facility-administered medications prior to visit.    Review of Systems  Gastrointestinal:  Positive for constipation (intermittent) and diarrhea (intermittent).  All other systems reviewed and are negative.      Objective:     BP 134/79   Pulse 80   Temp (!) 96.8 F (36 C) (Oral)   Resp 20   Ht 5\' 3"  (1.6 m)   Wt 190 lb 2 oz (  86.2 kg)   SpO2 97%   BMI 33.68 kg/m  BP Readings from Last 3 Encounters:  02/20/23 134/79  08/06/22 129/80  01/31/22 (!) 143/89   Wt Readings from Last 3 Encounters:  02/20/23 190 lb 2 oz (86.2 kg)  08/06/22 194 lb 6.4 oz (88.2 kg)  01/31/22 190 lb (86.2 kg)   SpO2 Readings from Last 3 Encounters:  02/20/23 97%  08/06/22 99%  01/31/22 96%      Physical Exam Vitals and nursing note reviewed.  Constitutional:      General: She is not in acute distress.    Appearance: Normal appearance. She is well-developed and well-groomed. She is obese. She is not ill-appearing, toxic-appearing or diaphoretic.  HENT:     Head: Normocephalic and atraumatic.     Jaw: There is normal jaw occlusion.     Right Ear: Hearing, tympanic membrane, ear canal and external ear normal.     Left Ear: Hearing, tympanic membrane, ear canal and external ear normal.     Nose: Nose normal.     Mouth/Throat:     Lips: Pink.     Mouth: Mucous membranes are moist.     Pharynx: Oropharynx is clear. Uvula midline.  Eyes:     General: Lids are normal.     Extraocular Movements: Extraocular movements intact.     Conjunctiva/sclera: Conjunctivae normal.     Pupils: Pupils are equal, round, and reactive to light.  Neck:     Thyroid: Thyroid mass (small on right - no changes from prior exam) present. No thyromegaly or thyroid tenderness.     Vascular: No carotid bruit or JVD.     Trachea: Trachea and phonation normal.  Cardiovascular:     Rate and Rhythm: Normal rate and regular rhythm.     Chest Wall: PMI is not displaced.      Pulses: Normal pulses.     Heart sounds: Normal heart sounds. No murmur heard.    No friction rub. No gallop.  Pulmonary:     Effort: Pulmonary effort is normal. No respiratory distress.     Breath sounds: Normal breath sounds. No wheezing.  Abdominal:     General: Bowel sounds are normal. There is no distension or abdominal bruit.     Palpations: Abdomen is soft. There is no hepatomegaly or splenomegaly.     Tenderness: There is no abdominal tenderness. There is no right CVA tenderness or left CVA tenderness.     Hernia: No hernia is present.  Genitourinary:    Comments: Deferred Musculoskeletal:        General: Normal range of motion.     Cervical back: Normal range of motion and neck supple.     Right lower leg: No edema.     Left lower leg: No edema.  Lymphadenopathy:     Cervical: No cervical adenopathy.  Skin:    General: Skin is warm and dry.     Capillary Refill: Capillary refill takes less than 2 seconds.     Coloration: Skin is not cyanotic, jaundiced or pale.     Findings: No rash.  Neurological:     General: No focal deficit present.     Mental Status: She is alert and oriented to person, place, and time.     Sensory: Sensation is intact.     Motor: Motor function is intact.     Coordination: Coordination is intact.     Gait: Gait is intact.     Deep Tendon Reflexes:  Reflexes are normal and symmetric.  Psychiatric:        Attention and Perception: Attention and perception normal.        Mood and Affect: Mood and affect normal.        Speech: Speech normal.        Behavior: Behavior normal. Behavior is cooperative.        Thought Content: Thought content normal.        Cognition and Memory: Cognition and memory normal.        Judgment: Judgment normal.      Last CBC Lab Results  Component Value Date   WBC 5.5 08/06/2022   HGB 14.0 08/06/2022   HCT 42.3 08/06/2022   MCV 91 08/06/2022   MCH 30.2 08/06/2022   RDW 12.3 08/06/2022   PLT 231 08/06/2022    Last metabolic panel Lab Results  Component Value Date   GLUCOSE 108 (H) 08/06/2022   NA 142 08/06/2022   K 4.1 08/06/2022   CL 100 08/06/2022   CO2 27 08/06/2022   BUN 16 08/06/2022   CREATININE 0.79 08/06/2022   EGFR 85 08/06/2022   CALCIUM 9.5 08/06/2022   PROT 6.8 08/06/2022   ALBUMIN 4.2 08/06/2022   LABGLOB 2.6 08/06/2022   AGRATIO 1.6 08/06/2022   BILITOT 0.3 08/06/2022   ALKPHOS 59 08/06/2022   AST 23 08/06/2022   ALT 23 08/06/2022   Last lipids Lab Results  Component Value Date   CHOL 155 08/06/2022   HDL 59 08/06/2022   LDLCALC 82 08/06/2022   TRIG 72 08/06/2022   CHOLHDL 2.6 08/06/2022   Last thyroid functions Lab Results  Component Value Date   TSH 1.710 08/06/2022   T4TOTAL 8.4 08/06/2022   Last vitamin D Lab Results  Component Value Date   VD25OH 49.3 08/06/2022        Assessment & Plan:    Routine Health Maintenance and Physical Exam  Immunization History  Administered Date(s) Administered   Influenza,inj,Quad PF,6+ Mos 04/15/2018, 05/04/2019   Td 03/18/2019    Health Maintenance  Topic Date Due   Zoster Vaccines- Shingrix (1 of 2) Never done   INFLUENZA VACCINE  10/12/2023 (Originally 02/12/2023)   MAMMOGRAM  06/19/2023   PAP SMEAR-Modifier  01/18/2026   DTaP/Tdap/Td (2 - Tdap) 03/17/2029   Colonoscopy  07/28/2029   Hepatitis C Screening  Completed   HIV Screening  Completed   HPV VACCINES  Aged Out   COVID-19 Vaccine  Discontinued    Discussed health benefits of physical activity, and encouraged her to engage in regular exercise appropriate for her age and condition.  Problem List Items Addressed This Visit   None Visit Diagnoses     Routine general medical examination at a health care facility    -  Primary   Relevant Orders   Anemia Profile B   CMP14+EGFR   Lipid panel   Thyroid Panel With TSH   Bayer DCA Hb A1c Waived   Visit for screening mammogram       Relevant Orders   MM 3D SCREENING MAMMOGRAM BILATERAL  BREAST   Screening for deficiency anemia       Relevant Orders   Anemia Profile B   Screening for lipoid disorders       Relevant Orders   Lipid panel   Screening for endocrine, metabolic and immunity disorder       Relevant Orders   CMP14+EGFR   Thyroid Panel With TSH   Bayer DCA Hb A1c  Waived      Return in about 1 year (around 02/20/2024), or if symptoms worsen or fail to improve, for CPE.     The above assessment and management plan was discussed with the patient. The patient verbalized understanding of and has agreed to the management plan. Patient is aware to call the clinic if they develop any new symptoms or if symptoms fail to improve or worsen. Patient is aware when to return to the clinic for a follow-up visit. Patient educated on when it is appropriate to go to the emergency department.   Kari Baars, FNP-C [facility] [address] [place], [place] [address] [phone]

## 2023-02-23 ENCOUNTER — Encounter: Payer: Self-pay | Admitting: Family Medicine

## 2023-06-24 DIAGNOSIS — H2513 Age-related nuclear cataract, bilateral: Secondary | ICD-10-CM | POA: Diagnosis not present

## 2023-06-24 DIAGNOSIS — H50011 Monocular esotropia, right eye: Secondary | ICD-10-CM | POA: Diagnosis not present

## 2023-07-02 DIAGNOSIS — M722 Plantar fascial fibromatosis: Secondary | ICD-10-CM | POA: Diagnosis not present

## 2023-07-02 DIAGNOSIS — M79672 Pain in left foot: Secondary | ICD-10-CM | POA: Diagnosis not present

## 2023-07-03 ENCOUNTER — Other Ambulatory Visit: Payer: Self-pay | Admitting: Family Medicine

## 2023-07-03 ENCOUNTER — Ambulatory Visit
Admission: RE | Admit: 2023-07-03 | Discharge: 2023-07-03 | Disposition: A | Discharge status: Home / Self Care | Payer: BC Managed Care – PPO | Source: Ambulatory Visit | Attending: Family Medicine | Admitting: Family Medicine

## 2023-07-03 DIAGNOSIS — I10 Essential (primary) hypertension: Secondary | ICD-10-CM

## 2023-07-03 DIAGNOSIS — Z1231 Encounter for screening mammogram for malignant neoplasm of breast: Secondary | ICD-10-CM | POA: Diagnosis not present

## 2023-08-06 DIAGNOSIS — M79672 Pain in left foot: Secondary | ICD-10-CM | POA: Diagnosis not present

## 2023-08-06 DIAGNOSIS — M722 Plantar fascial fibromatosis: Secondary | ICD-10-CM | POA: Diagnosis not present

## 2023-08-23 ENCOUNTER — Other Ambulatory Visit: Payer: Self-pay | Admitting: Family Medicine

## 2023-08-23 DIAGNOSIS — I10 Essential (primary) hypertension: Secondary | ICD-10-CM

## 2023-08-28 ENCOUNTER — Ambulatory Visit: Payer: BC Managed Care – PPO | Admitting: Family Medicine

## 2023-09-18 ENCOUNTER — Encounter: Payer: Self-pay | Admitting: Family Medicine

## 2023-09-18 ENCOUNTER — Ambulatory Visit (INDEPENDENT_AMBULATORY_CARE_PROVIDER_SITE_OTHER)

## 2023-09-18 ENCOUNTER — Ambulatory Visit: Payer: BC Managed Care – PPO | Admitting: Family Medicine

## 2023-09-18 VITALS — BP 142/86 | HR 75 | Temp 98.0°F | Ht 63.0 in | Wt 198.6 lb

## 2023-09-18 DIAGNOSIS — I10 Essential (primary) hypertension: Secondary | ICD-10-CM

## 2023-09-18 DIAGNOSIS — Z780 Asymptomatic menopausal state: Secondary | ICD-10-CM

## 2023-09-18 DIAGNOSIS — E041 Nontoxic single thyroid nodule: Secondary | ICD-10-CM

## 2023-09-18 DIAGNOSIS — B351 Tinea unguium: Secondary | ICD-10-CM

## 2023-09-18 DIAGNOSIS — E559 Vitamin D deficiency, unspecified: Secondary | ICD-10-CM

## 2023-09-18 DIAGNOSIS — M722 Plantar fascial fibromatosis: Secondary | ICD-10-CM | POA: Diagnosis not present

## 2023-09-18 DIAGNOSIS — E782 Mixed hyperlipidemia: Secondary | ICD-10-CM | POA: Diagnosis not present

## 2023-09-18 DIAGNOSIS — M25562 Pain in left knee: Secondary | ICD-10-CM

## 2023-09-18 DIAGNOSIS — G8929 Other chronic pain: Secondary | ICD-10-CM

## 2023-09-18 DIAGNOSIS — Z1329 Encounter for screening for other suspected endocrine disorder: Secondary | ICD-10-CM | POA: Diagnosis not present

## 2023-09-18 DIAGNOSIS — Z13820 Encounter for screening for osteoporosis: Secondary | ICD-10-CM

## 2023-09-18 DIAGNOSIS — J301 Allergic rhinitis due to pollen: Secondary | ICD-10-CM | POA: Insufficient documentation

## 2023-09-18 DIAGNOSIS — M25561 Pain in right knee: Secondary | ICD-10-CM

## 2023-09-18 MED ORDER — MOMETASONE FUROATE 50 MCG/ACT NA SUSP: 2.0000 | Freq: Every day | NASAL | 11 refills | Status: DC

## 2023-09-18 MED ORDER — LOSARTAN POTASSIUM-HCTZ 50-12.5 MG PO TABS: 1.0000 | ORAL_TABLET | Freq: Every day | ORAL | 3 refills | Status: DC

## 2023-09-18 NOTE — Progress Notes (Addendum)
 Subjective:  Patient ID: Katherine Pearson, female    DOB: [birth year], 63 y.o.   MRN: [id number]  Patient Care Team: Sonny Masters, FNP as PCP - General (Family Medicine) [place] Bali, MD (Inactive) as Consulting Physician (Gastroenterology)   Chief Complaint:  Medical Management of Chronic Issues (6 month follow up )   HPI: Katherine Pearson is a 63 y.o. female presenting on 09/18/2023 for Medical Management of Chronic Issues (6 month follow up )   Discussed the use of AI scribe software for clinical note transcription with the patient, who gave verbal consent to proceed.  History of Present Illness   The patient presents with medication review and management of multiple health concerns.  She is undergoing a medication review and notes discrepancies in her current regimen. She takes vitamin D3 at 50 mcg (2000 units) daily, differing from the 25 mcg (1000 units) in her records. She has stopped calcium supplements but is considering resuming them due to osteopenia, with a recommended dose of 1200 mg daily. She rarely uses acetaminophen, listed as 'as needed', and takes 1000 mg of Bilberry instead of the 100 mg recorded.  She experiences allergic rhinitis with a runny nose every morning for a couple of hours. She uses over-the-counter mucus relief pills and is considering restarting Nasonex, previously used for similar symptoms. No chest pain, leg swelling, or visual changes, though she suspects early cataracts. No recent sinus infections since moving from [place], where she had them biannually. She reports a sensation of fluid in her left ear, associated with postnasal drip, occurring a few weeks ago and lasting 10-20 seconds, described as a 'purring' sound. She occasionally feels off-balance, especially when leaning forward.  She has plantar fasciitis in her left foot for a couple of months, improved with better footwear. She reports intermittent knee pain and occasional left leg pain in  the crease, causing her knee to give out unexpectedly. The pain is random and not consistently related to activity.  She has a history of toenail fungus and recalls a discussion about Lamisil, not pursued due to liver function concerns. She has not tried topical treatments yet.  She inquires about her bone density test and thyroid nodule follow-up. A previous thyroid ultrasound did not recommend follow-up due to the nodule's small size.  She has a history of genetic markers CHEK2 and BRCA2 but is uncertain which one she carries. She plans to review her records to clarify this.         09/18/2023   10:24 AM 02/20/2023    8:14 AM 08/06/2022    8:12 AM 01/31/2022   10:34 AM 01/18/2021    9:43 AM  Depression screen PHQ 2/9  Decreased Interest 0 0 0 0 0  Down, Depressed, Hopeless 0 0 0 0 0  PHQ - 2 Score 0 0 0 0 0  Altered sleeping 1 1 1 1 1   Tired, decreased energy 1 0 0 0 0  Change in appetite 0 0 0 0 0  Feeling bad or failure about yourself  0 0 0 0 0  Trouble concentrating 0 0 0 0 0  Moving slowly or fidgety/restless 0 0 0 0 0  Suicidal thoughts 0 0 0 0 0  PHQ-9 Score 2 1 1 1 1   Difficult doing work/chores Not difficult at all Not difficult at all Not difficult at all  Not difficult at all      09/18/2023   10:24 AM 02/20/2023  8:14 AM 08/06/2022    8:12 AM 01/31/2022   10:35 AM  GAD 7 : Generalized Anxiety Score  Nervous, Anxious, on Edge 0 0 0 0  Control/stop worrying 0 0 0 0  Worry too much - different things 0 0 0 0  Trouble relaxing 0 0 0 0  Restless 0 0 0 0  Easily annoyed or irritable 0 0 0 0  Afraid - awful might happen 0 0 0 0  Total GAD 7 Score 0 0 0 0  Anxiety Difficulty Not difficult at all  Not difficult at all Not difficult at all        Relevant past medical, surgical, family, and social history reviewed and updated as indicated.  Allergies and medications reviewed and updated. Data reviewed: Chart in Epic.   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    Heart murmur    History of thyroid nodule    benign, left   Hyperlipidemia    Hypertension    Mitral valve prolapse    Vitamin D deficiency     Past Surgical History:  Procedure Laterality Date   BASAL CELL CARCINOMA EXCISION     COLONOSCOPY N/A 07/29/2019   Procedure: COLONOSCOPY;  Surgeon: Corbin Ade, MD;  [facility]: [facility];  Service: Endoscopy;  Laterality: N/A;  10:30   POLYPECTOMY  07/29/2019   Procedure: POLYPECTOMY;  Surgeon: Corbin Ade, MD;  Location: [facility];  Service: Endoscopy;;  recto-sigmoid    Social History   Socioeconomic History   Marital status: Married    Spouse name: Chrissie Noa   Number of children: 2   Years of education: Not on file   Highest education level: Associate degree: academic program  Occupational History   Occupation: Magazine features editor  Tobacco Use   Smoking status: Never   Smokeless tobacco: Never  Vaping Use   Vaping status: Never Used  Substance and Sexual Activity   Alcohol use: Yes    Comment: social   Drug use: Never   Sexual activity: Not on file  Other Topics Concern   Not on file  Social History Narrative   Not on file   Social Drivers of Health   Financial Resource Strain: Low Risk  (09/18/2023)   Overall Financial Resource Strain (CARDIA)    Difficulty of Paying Living Expenses: Not hard at all  Food Insecurity: No Food Insecurity (09/18/2023)   Hunger Vital Sign    Worried About Running Out of Food in the Last Year: Never true    Ran Out of Food in the Last Year: Never true  Transportation Needs: No Transportation Needs (09/18/2023)   PRAPARE - Administrator, Civil Service (Medical): No    Lack of Transportation (Non-Medical): No  Physical Activity: Unknown (09/18/2023)   Exercise Vital Sign    Days of Exercise per Week: 0 days    Minutes of Exercise per Session: Not on file  Stress: Stress Concern Present  (09/18/2023)   [employer] of Occupational Health - Occupational Stress Questionnaire    Feeling of Stress : To some extent  Social Connections: Moderately Isolated (09/18/2023)   Social Connection and Isolation Panel [NHANES]    Frequency of Communication with Friends and Family: More than three times a week    Frequency of Social Gatherings with Friends and Family: Once a week    Attends Religious  Services: Never    Active Member of Clubs or Organizations: No    Attends Engineer, structural: Not on file    Marital Status: Married  Intimate Partner Violence: Unknown (10/17/2021)   Received from [facility]   HITS    Physically Hurt: Not on file    Insult or Talk Down To: Not on file    Threaten Physical Harm: Not on file    Scream or Curse: Not on file    Outpatient Encounter Medications as of 09/18/2023  Medication Sig   acetaminophen (TYLENOL) 500 MG tablet Take 500-1,000 mg by mouth every 6 (six) hours as needed (for pain/headaches.).   atorvastatin (LIPITOR) 20 MG tablet Take 1 tablet (20 mg total) by mouth daily.   Bilberry 100 MG CAPS Take 1,000 mg by mouth daily.   Calcium Carb-Cholecalciferol (CALCIUM 600+D) 600-800 MG-UNIT TABS Take 1 tablet by mouth daily.   cholecalciferol (VITAMIN D3) 25 MCG (1000 UNIT) tablet Take 2,000 Units by mouth daily.   Multiple Vitamin (MULTIVITAMIN WITH MINERALS) TABS tablet Take 1 tablet by mouth daily.   [DISCONTINUED] losartan-hydrochlorothiazide (HYZAAR) 50-12.5 MG tablet TAKE 1 TABLET DAILY   losartan-hydrochlorothiazide (HYZAAR) 50-12.5 MG tablet Take 1 tablet by mouth daily.   mometasone (NASONEX) 50 MCG/ACT nasal spray Place 2 sprays into the nose daily.   [DISCONTINUED] mometasone (NASONEX) 50 MCG/ACT nasal spray Place 2 sprays into the nose daily. (Patient not taking: Reported on 02/20/2023)   No facility-administered encounter medications on file as of 09/18/2023.    Allergies  Allergen Reactions   Cefuroxime  Axetil Rash   Lisinopril-Hydrochlorothiazide Cough    Pertinent ROS per HPI, otherwise unremarkable      Objective:  BP (!) 142/86   Pulse 75   Temp 98 F (36.7 C)   Ht 5\' 3"  (1.6 m)   Wt 198 lb 9.6 oz (90.1 kg)   SpO2 98%   BMI 35.18 kg/m    Wt Readings from Last 3 Encounters:  09/18/23 198 lb 9.6 oz (90.1 kg)  02/20/23 190 lb 2 oz (86.2 kg)  08/06/22 194 lb 6.4 oz (88.2 kg)    Physical Exam Vitals and nursing note reviewed.  Constitutional:      General: She is not in acute distress.    Appearance: Normal appearance. She is well-developed and well-groomed. She is obese. She is not ill-appearing, toxic-appearing or diaphoretic.  HENT:     Head: Normocephalic and atraumatic.     Jaw: There is normal jaw occlusion.     Right Ear: Hearing normal. A middle ear effusion is present.     Left Ear: Hearing normal. A middle ear effusion is present.     Nose: Nose normal.     Mouth/Throat:     Lips: Pink.     Mouth: Mucous membranes are moist.     Pharynx: Uvula midline.  Eyes:     General: Lids are normal.     Conjunctiva/sclera: Conjunctivae normal.     Pupils: Pupils are equal, round, and reactive to light.  Neck:     Thyroid: Thyroid mass (small on right) present. No thyromegaly or thyroid tenderness.     Vascular: No carotid bruit or JVD.     Trachea: Trachea and phonation normal.  Cardiovascular:     Rate and Rhythm: Normal rate and regular rhythm.     Chest Wall: PMI is not displaced.     Pulses: Normal pulses.     Heart sounds: Normal heart  sounds. No murmur heard.    No friction rub. No gallop.  Pulmonary:     Effort: Pulmonary effort is normal. No respiratory distress.     Breath sounds: Normal breath sounds. No wheezing.  Abdominal:     General: Bowel sounds are normal. There is no abdominal bruit.     Palpations: Abdomen is soft. There is no hepatomegaly or splenomegaly.  Musculoskeletal:        General: Normal range of motion.     Cervical back:  Normal range of motion and neck supple.     Right lower leg: No edema.     Left lower leg: No edema.  Lymphadenopathy:     Cervical: No cervical adenopathy.  Skin:    General: Skin is warm and dry.     Capillary Refill: Capillary refill takes less than 2 seconds.     Coloration: Skin is not cyanotic, jaundiced or pale.     Findings: No rash.  Neurological:     General: No focal deficit present.     Mental Status: She is alert and oriented to person, place, and time.     Sensory: Sensation is intact.     Motor: Motor function is intact.     Coordination: Coordination is intact.     Gait: Gait is intact.     Deep Tendon Reflexes: Reflexes are normal and symmetric.  Psychiatric:        Attention and Perception: Attention and perception normal.        Mood and Affect: Mood and affect normal.        Speech: Speech normal.        Behavior: Behavior normal. Behavior is cooperative.        Thought Content: Thought content normal.        Cognition and Memory: Cognition and memory normal.        Judgment: Judgment normal.       Results for orders placed or performed in visit on 02/20/23  Bayer DCA Hb A1c Waived   Collection Time: 02/20/23  8:25 AM  Result Value Ref Range   HB A1C (BAYER DCA - WAIVED) 5.6 4.8 - 5.6 %  Anemia Profile B   Collection Time: 02/20/23  8:27 AM  Result Value Ref Range   Total Iron Binding Capacity 240 (L) 250 - 450 ug/dL   UIBC 161 096 - 045 ug/dL   Iron 409 27 - 811 ug/dL   Iron Saturation 44 15 - 55 %   Ferritin 128 15 - 150 ng/mL   Vitamin B-12 617 232 - 1,245 pg/mL   Folate >20.0 >3.0 ng/mL   WBC 5.0 3.4 - 10.8 x10E3/uL   RBC 4.44 3.77 - 5.28 x10E6/uL   Hemoglobin 13.7 11.1 - 15.9 g/dL   Hematocrit 91.4 78.2 - 46.6 %   MCV 90 79 - 97 fL   MCH 30.9 26.6 - 33.0 pg   MCHC 34.4 31.5 - 35.7 g/dL   RDW 95.6 21.3 - 08.6 %   Platelets 227 150 - 450 x10E3/uL   Neutrophils 53 Not Estab. %   Lymphs 36 Not Estab. %   Monocytes 8 Not Estab. %   Eos 2  Not Estab. %   Basos 1 Not Estab. %   Neutrophils Absolute 2.6 1.4 - 7.0 x10E3/uL   Lymphocytes Absolute 1.8 0.7 - 3.1 x10E3/uL   Monocytes Absolute 0.4 0.1 - 0.9 x10E3/uL   EOS (ABSOLUTE) 0.1 0.0 - 0.4 x10E3/uL   Basophils Absolute  0.1 0.0 - 0.2 x10E3/uL   Immature Granulocytes 0 Not Estab. %   Immature Grans (Abs) 0.0 0.0 - 0.1 x10E3/uL   Retic Ct Pct 1.4 0.6 - 2.6 %  CMP14+EGFR   Collection Time: 02/20/23  8:27 AM  Result Value Ref Range   Glucose 117 (H) 70 - 99 mg/dL   BUN 15 8 - 27 mg/dL   Creatinine, Ser 1.61 0.57 - 1.00 mg/dL   eGFR 82 >09 UE/AVW/0.98   BUN/Creatinine Ratio 19 12 - 28   Sodium 143 134 - 144 mmol/L   Potassium 3.6 3.5 - 5.2 mmol/L   Chloride 100 96 - 106 mmol/L   CO2 28 20 - 29 mmol/L   Calcium 9.8 8.7 - 10.3 mg/dL   Total Protein 6.8 6.0 - 8.5 g/dL   Albumin 4.3 3.9 - 4.9 g/dL   Globulin, Total 2.5 1.5 - 4.5 g/dL   Bilirubin Total 0.4 0.0 - 1.2 mg/dL   Alkaline Phosphatase 54 44 - 121 IU/L   AST 17 0 - 40 IU/L   ALT 16 0 - 32 IU/L  Lipid panel   Collection Time: 02/20/23  8:27 AM  Result Value Ref Range   Cholesterol, Total 163 100 - 199 mg/dL   Triglycerides 96 0 - 149 mg/dL   HDL 58 >11 mg/dL   VLDL Cholesterol Cal 18 5 - 40 mg/dL   LDL Chol Calc (NIH) 87 0 - 99 mg/dL   Chol/HDL Ratio 2.8 0.0 - 4.4 ratio  Thyroid Panel With TSH   Collection Time: 02/20/23  8:27 AM  Result Value Ref Range   TSH 2.070 0.450 - 4.500 uIU/mL   T4, Total 8.4 4.5 - 12.0 ug/dL   T3 Uptake Ratio 25 24 - 39 %   Free Thyroxine Index 2.1 1.2 - 4.9       Pertinent labs & imaging results that were available during my care of the patient were reviewed by me and considered in my medical decision making.  Assessment & Plan:  Ladonya Jerkins" was seen today for medical management of chronic issues.  Diagnoses and all orders for this visit:  Essential hypertension -     losartan-hydrochlorothiazide (HYZAAR) 50-12.5 MG tablet; Take 1 tablet by mouth daily. -      CMP14+EGFR -     CBC with Differential/Platelet -     Lipid panel -     Thyroid Panel With TSH  Seasonal allergic rhinitis due to pollen -     mometasone (NASONEX) 50 MCG/ACT nasal spray; Place 2 sprays into the nose daily.  Plantar fasciitis of left foot Followed by podiatry   Vitamin D deficiency -     CMP14+EGFR -     VITAMIN D 25 Hydroxy (Vit-D Deficiency, Fractures)  Mixed hyperlipidemia -     CMP14+EGFR -     Lipid panel  Chronic pain of both knees Provided information on exercises.  Toenail fungus -     CMP14+EGFR  Encounter for osteoporosis screening in asymptomatic postmenopausal patient -     DG [facility] DEXA; Future  Thyroid nodule -     Thyroid Panel With TSH -     US THYROID; Future     Assessment and Plan    Allergic Rhinitis Experiences symptoms including nasal congestion, rhinorrhea, and postnasal drip, particularly in the mornings. Symptoms are exacerbated by seasonal changes and pollen exposure, leading to eustachian tube dysfunction and fluid accumulation in the left ear. Nasonex is preferred for better symptom control. -  Prescribe Nasonex for daily use, especially during seasonal changes, to reduce nasal inflammation and eustachian tube dysfunction. - Continue over-the-counter mucus relief if desired, but emphasize Nasonex for better control.  Hypertension Blood pressure is slightly elevated at 142/86. Does not regularly monitor blood pressure at home. Home monitoring is advised to assess the need for medication adjustment. - Instruct to monitor blood pressure at home two to three times a week and report if consistently above 140/90.  Plantar Fasciitis Reports plantar fasciitis in the left foot, present for a couple of months. Symptoms include pain upon standing or walking, which improves with movement. Condition may contribute to knee pain and instability. Strengthening exercises recommended to improve symptoms. - Provide printout of strengthening  exercises for knee and foot rehabilitation. - Advise wearing supportive footwear to alleviate symptoms.  Toenail Fungus Has toenail fungus and is considering treatment options. Oral Lamisil was previously considered but not initiated due to potential hepatotoxicity. Has not tried topical treatments yet. Vicks VapoRub suggested as a first-line topical treatment before considering oral medication. - Recommend Vicks VapoRub applied to toenails nightly with socks. - Consider oral Lamisil if topical treatment is ineffective, with monitoring of liver function tests.  Thyroid Nodule Thyroid nodule is palpable. Reassessment is necessary to determine current status. - Order repeat thyroid ultrasound to assess the current status of the nodule. - Check thyroid function tests.  Osteopenia Previous bone density tests indicate osteopenia. Adequate calcium and vitamin D intake is essential for bone health. Repeat testing is necessary. - Order repeat bone density test (DEXA scan). - Recommend calcium supplementation at 1200 mg per day. - Continue vitamin D supplementation at 2000 IU per day.  General Health Maintenance Due for several routine health screenings. Family history of colorectal cancer emphasizes the importance of regular screenings. - Schedule mammogram for December 2023. - Schedule Pap smear for 2027. - Schedule colonoscopy for 2031.       Total time spent with patient today was 40 minutes.     Continue all other maintenance medications.  Follow up plan: Return in about 6 months (around 03/20/2024), or if symptoms worsen or fail to improve.   Continue healthy lifestyle choices, including diet (rich in fruits, vegetables, and lean proteins, and low in salt and simple carbohydrates) and exercise (at least 30 minutes of moderate physical activity daily).  Educational handout given for knee pain exercises, DASH  The above assessment and management plan was discussed with the patient.  The patient verbalized understanding of and has agreed to the management plan. Patient is aware to call the clinic if they develop any new symptoms or if symptoms persist or worsen. Patient is aware when to return to the clinic for a follow-up visit. Patient educated on when it is appropriate to go to the emergency department.   Kari Baars, FNP-C [facility] [phone]

## 2023-09-18 NOTE — Patient Instructions (Signed)
 Goal BP:  For patients younger than 60: Goal BP < 140/90. For patients 60 and older: Goal BP < 150/90. For patients with diabetes: Goal BP < 140/90.  Take your medications faithfully as prescribed. Maintain a healthy weight. Get at least 150 minutes of aerobic exercise per week. Minimize salt intake, less than 2000 mg per day. Minimize alcohol intake.  DASH Eating Plan DASH stands for "Dietary Approaches to Stop Hypertension." The DASH eating plan is a healthy eating plan that has been shown to reduce high blood pressure (hypertension). Additional health benefits may include reducing the risk of type 2 diabetes mellitus, heart disease, and stroke. The DASH eating plan may also help with weight loss.  WHAT DO I NEED TO KNOW ABOUT THE DASH EATING PLAN? For the DASH eating plan, you will follow these general guidelines: Choose foods with a percent daily value for sodium of less than 5% (as listed on the food label). Use salt-free seasonings or herbs instead of table salt or sea salt. Check with your health care provider or pharmacist before using salt substitutes. Eat lower-sodium products, often labeled as "lower sodium" or "no salt added." Eat fresh foods. Eat more vegetables, fruits, and low-fat dairy products. Choose whole grains. Look for the word "whole" as the first word in the ingredient list. Choose fish and skinless chicken or [place] more often than red meat. Limit fish, poultry, and meat to 6 oz (170 g) each day. Limit sweets, desserts, sugars, and sugary drinks. Choose heart-healthy fats. Limit cheese to 1 oz (28 g) per day. Eat more home-cooked food and less restaurant, buffet, and fast food. Limit fried foods. Cook foods using methods other than frying. Limit canned vegetables. If you do use them, rinse them well to decrease the sodium. When eating at a restaurant, ask that your food be prepared with less salt, or no salt if possible.  WHAT FOODS CAN I EAT? Seek help from  a dietitian for individual calorie needs.  Grains Whole grain or whole wheat bread. Brown rice. Whole grain or whole wheat pasta. Quinoa, bulgur, and whole grain cereals. Low-sodium cereals. Corn or whole wheat flour tortillas. Whole grain cornbread. Whole grain crackers. Low-sodium crackers.  Vegetables Fresh or frozen vegetables (raw, steamed, roasted, or grilled). Low-sodium or reduced-sodium tomato and vegetable juices. Low-sodium or reduced-sodium tomato sauce and paste. Low-sodium or reduced-sodium canned vegetables.   Fruits All fresh, canned (in natural juice), or frozen fruits.  Meat and Other Protein Products Ground beef (85% or leaner), grass-fed beef, or beef trimmed of fat. Skinless chicken or [place]. Ground chicken or [place]. Pork trimmed of fat. All fish and seafood. Eggs. Dried beans, peas, or lentils. Unsalted nuts and seeds. Unsalted canned beans.  Dairy Low-fat dairy products, such as skim or 1% milk, 2% or reduced-fat cheeses, low-fat ricotta or cottage cheese, or plain low-fat yogurt. Low-sodium or reduced-sodium cheeses.  Fats and Oils Tub margarines without trans fats. Light or reduced-fat mayonnaise and salad dressings (reduced sodium). Avocado. Safflower, olive, or canola oils. Natural peanut or almond butter.  Other Unsalted popcorn and pretzels. The items listed above may not be a complete list of recommended foods or beverages. Contact your dietitian for more options.  WHAT FOODS ARE NOT RECOMMENDED?  Grains White bread. White pasta. White rice. Refined cornbread. Bagels and croissants. Crackers that contain trans fat.  Vegetables Creamed or fried vegetables. Vegetables in a cheese sauce. Regular canned vegetables. Regular canned tomato sauce and paste. Regular tomato and vegetable juices.  Fruits Dried fruits. Canned fruit in light or heavy syrup. Fruit juice.  Meat and Other Protein Products Fatty cuts of meat. Ribs, chicken wings, bacon, sausage,  bologna, salami, chitterlings, fatback, hot dogs, bratwurst, and packaged luncheon meats. Salted nuts and seeds. Canned beans with salt.  Dairy Whole or 2% milk, cream, half-and-half, and cream cheese. Whole-fat or sweetened yogurt. Full-fat cheeses or blue cheese. Nondairy creamers and whipped toppings. Processed cheese, cheese spreads, or cheese curds.  Condiments Onion and garlic salt, seasoned salt, table salt, and sea salt. Canned and packaged gravies. Worcestershire sauce. Tartar sauce. Barbecue sauce. Teriyaki sauce. Soy sauce, including reduced sodium. Steak sauce. Fish sauce. Oyster sauce. Cocktail sauce. Horseradish. Ketchup and mustard. Meat flavorings and tenderizers. Bouillon cubes. Hot sauce. Tabasco sauce. Marinades. Taco seasonings. Relishes.  Fats and Oils Butter, stick margarine, lard, shortening, ghee, and bacon fat. Coconut, palm kernel, or palm oils. Regular salad dressings.  Other Pickles and olives. Salted popcorn and pretzels.  The items listed above may not be a complete list of foods and beverages to avoid. Contact your dietitian for more information.  WHERE CAN I FIND MORE INFORMATION? National Heart, Lung, and Blood Institute: [email or web address] Document Released: 06/19/2011 Document Revised: 11/14/2013 Document Reviewed: 05/04/2013 [facility] Patient Information 2015 [place], [place]. This information is not intended to replace advice given to you by your health care provider. Make sure you discuss any questions you have with your health care provider.   I think that you would greatly benefit from seeing a nutritionist.  If you are interested, please call Dr. Gerilyn Pilgrim at [phone] to schedule an appointment.

## 2023-09-19 LAB — CBC WITH DIFFERENTIAL/PLATELET
Basophils Absolute: 0 10*3/uL (ref 0.0–0.2)
Basos: 1 %
EOS (ABSOLUTE): 0.1 10*3/uL (ref 0.0–0.4)
Eos: 2 %
Hematocrit: 42.9 % (ref 34.0–46.6)
Hemoglobin: 14.5 g/dL (ref 11.1–15.9)
Immature Grans (Abs): 0 10*3/uL (ref 0.0–0.1)
Immature Granulocytes: 0 %
Lymphocytes Absolute: 1.9 10*3/uL (ref 0.7–3.1)
Lymphs: 35 %
MCH: 30.9 pg (ref 26.6–33.0)
MCHC: 33.8 g/dL (ref 31.5–35.7)
MCV: 92 fL (ref 79–97)
Monocytes Absolute: 0.4 10*3/uL (ref 0.1–0.9)
Monocytes: 8 %
Neutrophils Absolute: 2.9 10*3/uL (ref 1.4–7.0)
Neutrophils: 54 %
Platelets: 234 10*3/uL (ref 150–450)
RBC: 4.69 x10E6/uL (ref 3.77–5.28)
RDW: 12.2 % (ref 11.7–15.4)
WBC: 5.4 10*3/uL (ref 3.4–10.8)

## 2023-09-19 LAB — CMP14+EGFR
ALT: 19 IU/L (ref 0–32)
AST: 19 IU/L (ref 0–40)
Albumin: 4.4 g/dL (ref 3.9–4.9)
Alkaline Phosphatase: 61 IU/L (ref 44–121)
BUN/Creatinine Ratio: 22 (ref 12–28)
BUN: 17 mg/dL (ref 8–27)
Bilirubin Total: 0.3 mg/dL (ref 0.0–1.2)
CO2: 28 mmol/L (ref 20–29)
Calcium: 9.9 mg/dL (ref 8.7–10.3)
Chloride: 102 mmol/L (ref 96–106)
Creatinine, Ser: 0.79 mg/dL (ref 0.57–1.00)
Globulin, Total: 2.7 g/dL (ref 1.5–4.5)
Glucose: 99 mg/dL (ref 70–99)
Potassium: 4.1 mmol/L (ref 3.5–5.2)
Sodium: 143 mmol/L (ref 134–144)
Total Protein: 7.1 g/dL (ref 6.0–8.5)
eGFR: 85 mL/min/{1.73_m2} (ref >59)

## 2023-09-19 LAB — LIPID PANEL
Chol/HDL Ratio: 3.1 ratio (ref 0.0–4.4)
Cholesterol, Total: 163 mg/dL (ref 100–199)
HDL: 53 mg/dL (ref >39)
LDL Chol Calc (NIH): 82 mg/dL (ref 0–99)
Triglycerides: 164 mg/dL — ABNORMAL HIGH (ref 0–149)
VLDL Cholesterol Cal: 28 mg/dL (ref 5–40)

## 2023-09-19 LAB — THYROID PANEL WITH TSH
Free Thyroxine Index: 1.8 (ref 1.2–4.9)
T3 Uptake Ratio: 24 % (ref 24–39)
T4, Total: 7.6 ug/dL (ref 4.5–12.0)
TSH: 1.2 u[IU]/mL (ref 0.450–4.500)

## 2023-09-19 LAB — VITAMIN D 25 HYDROXY (VIT D DEFICIENCY, FRACTURES): Vit D, 25-Hydroxy: 49.9 ng/mL (ref 30.0–100.0)

## 2023-09-20 ENCOUNTER — Other Ambulatory Visit: Payer: Self-pay | Admitting: Family Medicine

## 2023-09-20 ENCOUNTER — Encounter: Payer: Self-pay | Admitting: Family Medicine

## 2023-09-20 DIAGNOSIS — E782 Mixed hyperlipidemia: Secondary | ICD-10-CM

## 2023-09-22 ENCOUNTER — Encounter: Payer: Self-pay | Admitting: Family Medicine

## 2023-09-25 ENCOUNTER — Encounter: Payer: Self-pay | Admitting: Family Medicine

## 2023-10-02 ENCOUNTER — Ambulatory Visit (HOSPITAL_COMMUNITY)
Admission: RE | Admit: 2023-10-02 | Discharge: 2023-10-02 | Disposition: A | Discharge status: Home / Self Care | Source: Ambulatory Visit | Attending: Family Medicine | Admitting: Family Medicine

## 2023-10-02 DIAGNOSIS — E041 Nontoxic single thyroid nodule: Secondary | ICD-10-CM | POA: Diagnosis not present

## 2023-10-02 DIAGNOSIS — E042 Nontoxic multinodular goiter: Secondary | ICD-10-CM | POA: Diagnosis not present

## 2023-10-09 ENCOUNTER — Encounter: Payer: Self-pay | Admitting: Family Medicine

## 2023-10-12 ENCOUNTER — Other Ambulatory Visit: Payer: Self-pay | Admitting: Family Medicine

## 2023-10-12 DIAGNOSIS — J301 Allergic rhinitis due to pollen: Secondary | ICD-10-CM

## 2023-10-14 ENCOUNTER — Encounter: Payer: Self-pay | Admitting: Family Medicine

## 2023-11-05 ENCOUNTER — Encounter: Payer: Self-pay | Admitting: *Deleted

## 2024-02-26 ENCOUNTER — Encounter: Admitting: Family Medicine

## 2024-02-26 ENCOUNTER — Encounter: Payer: BC Managed Care – PPO | Admitting: Family Medicine

## 2024-03-01 ENCOUNTER — Encounter: Payer: Self-pay | Admitting: Family Medicine

## 2024-03-18 ENCOUNTER — Other Ambulatory Visit: Payer: Self-pay | Admitting: Medical Genetics

## 2024-03-18 ENCOUNTER — Ambulatory Visit: Admitting: Family Medicine

## 2024-03-18 ENCOUNTER — Other Ambulatory Visit: Payer: Self-pay | Admitting: Family Medicine

## 2024-03-18 DIAGNOSIS — E782 Mixed hyperlipidemia: Secondary | ICD-10-CM

## 2024-03-23 ENCOUNTER — Ambulatory Visit: Admitting: Family Medicine

## 2024-03-25 ENCOUNTER — Encounter: Payer: Self-pay | Admitting: Family Medicine

## 2024-03-25 ENCOUNTER — Ambulatory Visit: Admitting: Family Medicine

## 2024-03-25 VITALS — BP 122/58 | HR 70 | Temp 97.8°F | Ht 63.0 in | Wt 185.6 lb

## 2024-03-25 DIAGNOSIS — M722 Plantar fascial fibromatosis: Secondary | ICD-10-CM

## 2024-03-25 DIAGNOSIS — I10 Essential (primary) hypertension: Secondary | ICD-10-CM | POA: Diagnosis not present

## 2024-03-25 DIAGNOSIS — E559 Vitamin D deficiency, unspecified: Secondary | ICD-10-CM | POA: Diagnosis not present

## 2024-03-25 DIAGNOSIS — E782 Mixed hyperlipidemia: Secondary | ICD-10-CM

## 2024-03-25 DIAGNOSIS — J301 Allergic rhinitis due to pollen: Secondary | ICD-10-CM

## 2024-03-25 DIAGNOSIS — I059 Rheumatic mitral valve disease, unspecified: Secondary | ICD-10-CM

## 2024-03-25 MED ORDER — LOSARTAN POTASSIUM-HCTZ 50-12.5 MG PO TABS: 1.0000 | ORAL_TABLET | Freq: Every day | ORAL | 3 refills | Status: AC

## 2024-03-25 MED ORDER — ATORVASTATIN CALCIUM 20 MG PO TABS: 20.0000 mg | ORAL_TABLET | Freq: Every day | ORAL | 3 refills | Status: DC

## 2024-03-25 MED ORDER — MOMETASONE FUROATE 50 MCG/ACT NA SUSP: 2.0000 | Freq: Every day | NASAL | 11 refills | Status: DC

## 2024-03-25 NOTE — Progress Notes (Signed)
 Subjective:  Patient ID: Katherine Pearson, female    DOB: 1961/05/27, 63 y.o.   MRN: [id number]  Patient Care Team: Severa Rock HERO, FNP as PCP - General (Family Medicine) Harvey Margo CROME, MD (Inactive) as Consulting Physician (Gastroenterology)   Chief Complaint:  Medical Management of Chronic Issues (6 month follow up )   HPI: Katherine Pearson is a 63 y.o. female presenting on 03/25/2024 for Medical Management of Chronic Issues (6 month follow up )   Katherine Pearson is a 63 year old female who presents for follow-up of ear fluid and plantar fasciitis.  She experiences persistent fluid in her ears, particularly noticeable when bending down. Despite using a nasal spray for a few weeks, the symptoms have not completely resolved. She occasionally senses ear fluid but not daily. Additionally, she has a runny nose every morning.  She has a history of toenail fungus and attempted treatment with Vicks VapoRub, which she used sparingly due to its strong odor.  She has been experiencing a recurrence of plantar fasciitis for the past week, with pain most severe in the morning upon getting out of bed, improving as the day progresses. She uses a ball to massage her foot in the mornings and considers increasing the frequency. Occasional knee pain is noted. She has tried using a step stool at her desk to improve posture.  She is currently taking losartan  hydrochlorothiazide  for hypertension and reports slightly elevated blood pressure during a recent check. She does not monitor her blood pressure at home as she misplaced her machine.  She takes calcium  600 mg with vitamin D  20 IU twice daily and an additional vitamin D  supplement. She is concerned about her vitamin D  levels and plans to have them checked.  She notes a weight loss from 198 lbs in March to 185 lbs currently, which she finds unexpected. She also mentions a history of high triglycerides and has consumed oatmeal and Cocoa Krispies for  breakfast.  No consistent chest pain, leg swelling, or shortness of breath, but she occasionally experiences chest area pain. Her plantar fasciitis pain is worse in the morning.          Relevant past medical, surgical, family, and social history reviewed and updated as indicated.  Allergies and medications reviewed and updated. Data reviewed: Chart in Epic.   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    Heart murmur    History of thyroid  nodule    benign, left   Hyperlipidemia    Hypertension    Mitral valve prolapse    Vitamin D  deficiency     Past Surgical History:  Procedure Laterality Date   BASAL CELL CARCINOMA EXCISION     COLONOSCOPY N/A 07/29/2019   Procedure: COLONOSCOPY;  Surgeon: Shaaron Lamar HERO, MD;  Location: [facility];  Service: Endoscopy;  Laterality: N/A;  10:30   POLYPECTOMY  07/29/2019   Procedure: POLYPECTOMY;  Surgeon: Shaaron Lamar HERO, MD;  Location: [facility];  Service: Endoscopy;;  recto-sigmoid    Social History   Socioeconomic History   Marital status: Married    Spouse name: Elsie   Number of children: 2   Years of education: Not on file   Highest education level: Associate degree: academic program  Occupational History   Occupation: [employer]  Tobacco Use   Smoking status: Never   Smokeless  tobacco: Never  Vaping Use   Vaping status: Never Used  Substance and Sexual Activity   Alcohol use: Yes    Comment: social   Drug use: Never   Sexual activity: Not on file  Other Topics Concern   Not on file  Social History Narrative   Not on file   Social Drivers of Health   Financial Resource Strain: Low Risk  (02/26/2024)   Overall Financial Resource Strain (CARDIA)    Difficulty of Paying Living Expenses: Not hard at all  Food Insecurity: No Food Insecurity (02/26/2024)   Hunger Vital Sign    Worried About Running Out of Food in the Last  Year: Never true    Ran Out of Food in the Last Year: Never true  Transportation Needs: No Transportation Needs (02/26/2024)   PRAPARE - Administrator, Civil Service (Medical): No    Lack of Transportation (Non-Medical): No  Physical Activity: Inactive (02/26/2024)   Exercise Vital Sign    Days of Exercise per Week: 0 days    Minutes of Exercise per Session: Not on file  Stress: No Stress Concern Present (02/26/2024)   [employer] of Occupational Health - Occupational Stress Questionnaire    Feeling of Stress: Only a little  Social Connections: Socially Isolated (02/26/2024)   Social Connection and Isolation Panel    Frequency of Communication with Friends and Family: Once a week    Frequency of Social Gatherings with Friends and Family: Once a week    Attends Religious Services: Never    Database administrator or Organizations: No    Attends Engineer, structural: Not on file    Marital Status: Married  Intimate Partner Violence: Unknown (10/17/2021)   Received from [facility]   HITS    Physically Hurt: Not on file    Insult or Talk Down To: Not on file    Threaten Physical Harm: Not on file    Scream or Curse: Not on file    Outpatient Encounter Medications as of 03/25/2024  Medication Sig   acetaminophen (TYLENOL) 500 MG tablet Take 500-1,000 mg by mouth every 6 (six) hours as needed (for pain/headaches.).   Bilberry 100 MG CAPS Take 1,000 mg by mouth daily.   Calcium  Carb-Cholecalciferol (CALCIUM  600+D) 600-800 MG-UNIT TABS Take 1 tablet by mouth daily.   cholecalciferol (VITAMIN D3) 25 MCG (1000 UNIT) tablet Take 2,000 Units by mouth daily.   Multiple Vitamin (MULTIVITAMIN WITH MINERALS) TABS tablet Take 1 tablet by mouth daily.   [DISCONTINUED] atorvastatin  (LIPITOR) 20 MG tablet TAKE 1 TABLET DAILY   [DISCONTINUED] losartan -hydrochlorothiazide  (HYZAAR) 50-12.5 MG tablet Take 1 tablet by mouth daily.   [DISCONTINUED] mometasone  (NASONEX ) 50 MCG/ACT  nasal spray USE 2 SPRAYS NASALLY DAILY   atorvastatin  (LIPITOR) 20 MG tablet Take 1 tablet (20 mg total) by mouth daily.   losartan -hydrochlorothiazide  (HYZAAR) 50-12.5 MG tablet Take 1 tablet by mouth daily.   mometasone  (NASONEX ) 50 MCG/ACT nasal spray Place 2 sprays into the nose daily.   No facility-administered encounter medications on file as of 03/25/2024.    Allergies  Allergen Reactions   Cefuroxime Axetil Rash   Lisinopril -Hydrochlorothiazide  Cough    Pertinent ROS per HPI, otherwise unremarkable      Objective:  BP (!) 122/58 (BP Location: Right Arm, Cuff Size: Normal)   Pulse 70   Temp 97.8 F (36.6 C)   Ht 5' 3 (1.6 m)   Wt 185 lb 9.6 oz (84.2 kg)  SpO2 96%   BMI 32.88 kg/m    Wt Readings from Last 3 Encounters:  03/25/24 185 lb 9.6 oz (84.2 kg)  09/18/23 198 lb 9.6 oz (90.1 kg)  02/20/23 190 lb 2 oz (86.2 kg)    Physical Exam Vitals and nursing note reviewed.  Constitutional:      General: She is not in acute distress.    Appearance: Normal appearance. She is well-developed and well-groomed. She is obese. She is not ill-appearing, toxic-appearing or diaphoretic.  HENT:     Head: Normocephalic and atraumatic.     Jaw: There is normal jaw occlusion.     Right Ear: Hearing, ear canal and external ear normal. A middle ear effusion is present. Tympanic membrane is not erythematous.     Left Ear: Hearing, ear canal and external ear normal. A middle ear effusion is present. Tympanic membrane is not erythematous.     Nose: Nose normal.     Mouth/Throat:     Lips: Pink.     Mouth: Mucous membranes are moist.     Pharynx: Oropharynx is clear. Uvula midline.  Eyes:     General: Lids are normal.     Extraocular Movements: Extraocular movements intact.     Conjunctiva/sclera: Conjunctivae normal.     Pupils: Pupils are equal, round, and reactive to light.  Neck:     Thyroid : Thyroid  mass (right sided, small, no changes) present. No thyromegaly or thyroid   tenderness.     Vascular: No carotid bruit or JVD.     Trachea: Trachea and phonation normal.  Cardiovascular:     Rate and Rhythm: Normal rate and regular rhythm.     Chest Wall: PMI is not displaced.     Pulses: Normal pulses.     Heart sounds: Normal heart sounds. No murmur heard.    No friction rub. No gallop.  Pulmonary:     Effort: Pulmonary effort is normal. No respiratory distress.     Breath sounds: Normal breath sounds. No wheezing.  Abdominal:     General: Bowel sounds are normal. There is no distension or abdominal bruit.     Palpations: Abdomen is soft. There is no hepatomegaly or splenomegaly.     Tenderness: There is no abdominal tenderness. There is no right CVA tenderness or left CVA tenderness.     Hernia: No hernia is present.  Musculoskeletal:        General: Normal range of motion.     Cervical back: Normal range of motion and neck supple.     Right lower leg: No edema.     Left lower leg: No edema.  Lymphadenopathy:     Cervical: No cervical adenopathy.  Skin:    General: Skin is warm and dry.     Capillary Refill: Capillary refill takes less than 2 seconds.     Coloration: Skin is not cyanotic, jaundiced or pale.     Findings: No rash.  Neurological:     General: No focal deficit present.     Mental Status: She is alert and oriented to person, place, and time.     Sensory: Sensation is intact.     Motor: Motor function is intact.     Coordination: Coordination is intact.     Gait: Gait is intact.     Deep Tendon Reflexes: Reflexes are normal and symmetric.  Psychiatric:        Attention and Perception: Attention and perception normal.        Mood  and Affect: Mood and affect normal.        Speech: Speech normal.        Behavior: Behavior normal. Behavior is cooperative.        Thought Content: Thought content normal.        Cognition and Memory: Cognition and memory normal.        Judgment: Judgment normal.      Results for orders placed or  performed in visit on 09/18/23  CMP14+EGFR   Collection Time: 09/18/23 11:14 AM  Result Value Ref Range   Glucose 99 70 - 99 mg/dL   BUN 17 8 - 27 mg/dL   Creatinine, Ser 9.20 0.57 - 1.00 mg/dL   eGFR 85 >40 fO/fpw/8.26   BUN/Creatinine Ratio 22 12 - 28   Sodium 143 134 - 144 mmol/L   Potassium 4.1 3.5 - 5.2 mmol/L   Chloride 102 96 - 106 mmol/L   CO2 28 20 - 29 mmol/L   Calcium  9.9 8.7 - 10.3 mg/dL   Total Protein 7.1 6.0 - 8.5 g/dL   Albumin 4.4 3.9 - 4.9 g/dL   Globulin, Total 2.7 1.5 - 4.5 g/dL   Bilirubin Total 0.3 0.0 - 1.2 mg/dL   Alkaline Phosphatase 61 44 - 121 IU/L   AST 19 0 - 40 IU/L   ALT 19 0 - 32 IU/L  CBC with Differential/Platelet   Collection Time: 09/18/23 11:14 AM  Result Value Ref Range   WBC 5.4 3.4 - 10.8 x10E3/uL   RBC 4.69 3.77 - 5.28 x10E6/uL   Hemoglobin 14.5 11.1 - 15.9 g/dL   Hematocrit 57.0 65.9 - 46.6 %   MCV 92 79 - 97 fL   MCH 30.9 26.6 - 33.0 pg   MCHC 33.8 31.5 - 35.7 g/dL   RDW 87.7 88.2 - 84.5 %   Platelets 234 150 - 450 x10E3/uL   Neutrophils 54 Not Estab. %   Lymphs 35 Not Estab. %   Monocytes 8 Not Estab. %   Eos 2 Not Estab. %   Basos 1 Not Estab. %   Neutrophils Absolute 2.9 1.4 - 7.0 x10E3/uL   Lymphocytes Absolute 1.9 0.7 - 3.1 x10E3/uL   Monocytes Absolute 0.4 0.1 - 0.9 x10E3/uL   EOS (ABSOLUTE) 0.1 0.0 - 0.4 x10E3/uL   Basophils Absolute 0.0 0.0 - 0.2 x10E3/uL   Immature Granulocytes 0 Not Estab. %   Immature Grans (Abs) 0.0 0.0 - 0.1 x10E3/uL  Lipid panel   Collection Time: 09/18/23 11:14 AM  Result Value Ref Range   Cholesterol, Total 163 100 - 199 mg/dL   Triglycerides 835 (H) 0 - 149 mg/dL   HDL 53 >60 mg/dL   VLDL Cholesterol Cal 28 5 - 40 mg/dL   LDL Chol Calc (NIH) 82 0 - 99 mg/dL   Chol/HDL Ratio 3.1 0.0 - 4.4 ratio  Thyroid  Panel With TSH   Collection Time: 09/18/23 11:14 AM  Result Value Ref Range   TSH 1.200 0.450 - 4.500 uIU/mL   T4, Total 7.6 4.5 - 12.0 ug/dL   T3 Uptake Ratio 24 24 - 39 %   Free  Thyroxine Index 1.8 1.2 - 4.9  VITAMIN D  25 Hydroxy (Vit-D Deficiency, Fractures)   Collection Time: 09/18/23 11:14 AM  Result Value Ref Range   Vit D, 25-Hydroxy 49.9 30.0 - 100.0 ng/mL       Pertinent labs & imaging results that were available during my care of the patient were reviewed by me and considered in  my medical decision making.  Assessment & Plan:  Katherine Pearson was seen today for medical management of chronic issues.  Diagnoses and all orders for this visit:  Mixed hyperlipidemia -     atorvastatin  (LIPITOR) 20 MG tablet; Take 1 tablet (20 mg total) by mouth daily. -     Lipid panel  Essential hypertension -     losartan -hydrochlorothiazide  (HYZAAR) 50-12.5 MG tablet; Take 1 tablet by mouth daily.  Vitamin D  deficiency -     VITAMIN D  25 Hydroxy (Vit-D Deficiency, Fractures)  Plantar fasciitis of left foot Discussed care in detail. Will refer to podiatry if worsening.   Mitral valve disorder -     atorvastatin  (LIPITOR) 20 MG tablet; Take 1 tablet (20 mg total) by mouth daily.  Seasonal allergic rhinitis due to pollen -     mometasone  (NASONEX ) 50 MCG/ACT nasal spray; Place 2 sprays into the nose daily.     Chronic eustachian tube dysfunction with aural fullness Persistent fluid in ears with intermittent aural fullness, exacerbated by bending. Previous inconsistent use of Nasonex  led to persistent symptoms. - Instruct to use Nasonex  daily for two weeks, one spray in each nostril.  Plantar fasciitis Recurrent plantar fasciitis, worse in the morning. Previous steroid injection provided relief. Current symptoms not as severe as before. Discussed non-invasive treatments including stretching and use of supportive footwear. - Advise daily rolling of foot on a ball or frozen water  bottle. - Recommend use of shoe inserts for additional arch support. - Consider referral to Dr. Roddie if symptoms persist or worsen.  Essential hypertension Blood pressure slightly  elevated at 130/80 mmHg. Currently on losartan  hydrochlorothiazide . No home monitoring of blood pressure. - Check blood pressure manually during visit. - Ensure losartan  hydrochlorothiazide  prescription is refilled.  Mixed hyperlipidemia Previous triglycerides were elevated. Dietary habits include oatmeal and Cocoa Krispies. - Order cholesterol panel to assess triglyceride levels.  Onychomycosis Tried Vicks VapoRub with limited use due to odor. Discussed oral antifungals as an alternative, noting the need for liver function monitoring due to potential hepatotoxicity.  General Health Maintenance Calcium  and vitamin D  supplementation discussed. Current regimen includes calcium  600 mg with vitamin D  20 IU, and additional vitamin D3 supplementation. - Check vitamin D  levels.          Continue all other maintenance medications.  Follow up plan: Return in about 6 months (around 09/22/2024), or if symptoms worsen or fail to improve, for Annual Physical.   Continue healthy lifestyle choices, including diet (rich in fruits, vegetables, and lean proteins, and low in salt and simple carbohydrates) and exercise (at least 30 minutes of moderate physical activity daily).  Educational handout given for plantar fasciitis   The above assessment and management plan was discussed with the patient. The patient verbalized understanding of and has agreed to the management plan. Patient is aware to call the clinic if they develop any new symptoms or if symptoms persist or worsen. Patient is aware when to return to the clinic for a follow-up visit. Patient educated on when it is appropriate to go to the emergency department.   Katherine Bruns, FNP-C [facility] [phone]

## 2024-03-26 ENCOUNTER — Ambulatory Visit: Payer: Self-pay | Admitting: Family Medicine

## 2024-03-26 LAB — LIPID PANEL
Chol/HDL Ratio: 2.6 ratio (ref 0.0–4.4)
Cholesterol, Total: 149 mg/dL (ref 100–199)
HDL: 57 mg/dL (ref >39)
LDL Chol Calc (NIH): 74 mg/dL (ref 0–99)
Triglycerides: 101 mg/dL (ref 0–149)
VLDL Cholesterol Cal: 18 mg/dL (ref 5–40)

## 2024-03-26 LAB — VITAMIN D 25 HYDROXY (VIT D DEFICIENCY, FRACTURES): Vit D, 25-Hydroxy: 68.9 ng/mL (ref 30.0–100.0)

## 2024-06-09 ENCOUNTER — Other Ambulatory Visit: Payer: Self-pay | Admitting: Family Medicine

## 2024-06-09 DIAGNOSIS — E782 Mixed hyperlipidemia: Secondary | ICD-10-CM

## 2024-06-09 DIAGNOSIS — I059 Rheumatic mitral valve disease, unspecified: Secondary | ICD-10-CM

## 2024-06-17 ENCOUNTER — Encounter: Payer: Self-pay | Admitting: Family Medicine

## 2024-06-29 DIAGNOSIS — H2513 Age-related nuclear cataract, bilateral: Secondary | ICD-10-CM | POA: Diagnosis not present

## 2024-06-29 DIAGNOSIS — H5213 Myopia, bilateral: Secondary | ICD-10-CM | POA: Diagnosis not present

## 2024-06-29 DIAGNOSIS — H50011 Monocular esotropia, right eye: Secondary | ICD-10-CM | POA: Diagnosis not present

## 2024-07-12 DIAGNOSIS — M7741 Metatarsalgia, right foot: Secondary | ICD-10-CM | POA: Diagnosis not present

## 2024-07-12 DIAGNOSIS — M779 Enthesopathy, unspecified: Secondary | ICD-10-CM | POA: Diagnosis not present

## 2024-07-12 DIAGNOSIS — M79671 Pain in right foot: Secondary | ICD-10-CM | POA: Diagnosis not present

## 2024-07-15 ENCOUNTER — Other Ambulatory Visit: Payer: Self-pay | Admitting: Family Medicine

## 2024-07-15 DIAGNOSIS — Z1231 Encounter for screening mammogram for malignant neoplasm of breast: Secondary | ICD-10-CM

## 2024-07-18 ENCOUNTER — Inpatient Hospital Stay
Admission: RE | Admit: 2024-07-18 | Discharge: 2024-07-18 | Disposition: A | Source: Ambulatory Visit | Attending: Family Medicine

## 2024-07-18 DIAGNOSIS — Z1231 Encounter for screening mammogram for malignant neoplasm of breast: Secondary | ICD-10-CM

## 2024-07-20 ENCOUNTER — Ambulatory Visit: Payer: Self-pay | Admitting: Family Medicine

## 2024-08-05 ENCOUNTER — Encounter: Payer: Self-pay | Admitting: Family Medicine

## 2024-08-10 ENCOUNTER — Ambulatory Visit: Admitting: Family Medicine

## 2024-08-10 ENCOUNTER — Encounter: Payer: Self-pay | Admitting: Family Medicine

## 2024-08-10 VITALS — BP 154/94 | HR 76 | Temp 96.9°F | Ht 63.0 in | Wt 185.8 lb

## 2024-08-10 DIAGNOSIS — J0140 Acute pansinusitis, unspecified: Secondary | ICD-10-CM

## 2024-08-10 DIAGNOSIS — J301 Allergic rhinitis due to pollen: Secondary | ICD-10-CM

## 2024-08-10 MED ORDER — MOMETASONE FUROATE 50 MCG/ACT NA SUSP: 2.0000 | Freq: Every day | NASAL | 11 refills | Status: AC

## 2024-08-10 MED ORDER — AMOXICILLIN-POT CLAVULANATE 875-125 MG PO TABS: 1.0000 | ORAL_TABLET | Freq: Two times a day (BID) | ORAL | 0 refills | Status: AC

## 2024-08-10 NOTE — Progress Notes (Signed)
 "    Subjective:  Patient ID: Katherine Pearson, female    DOB: 16-Nov-1960, 64 y.o.   MRN: [id number]  Patient Care Team: Severa Rock HERO, FNP as PCP - General (Family Medicine) Harvey Margo CROME, MD (Inactive) as Consulting Physician (Gastroenterology)   Chief Complaint:  Cough and Nasal Congestion (X 1 week )   HPI: Katherine Pearson is a 64 y.o. female presenting on 08/10/2024 for Cough and Nasal Congestion (X 1 week )   Katherine Pearson is a 64 year old female who presents with upper respiratory symptoms including sneezing, coughing, and nasal congestion.  Since last Wednesday afternoon, she began experiencing sneezing, which progressed to feeling unwell by Wednesday night. She did not attend work on Thursday or Friday due to her symptoms.  She has a persistent cough with excessive mucus production. Initially, the mucus was yellowish, and she described a sensation of having a 'wet rubber band' in her nose. The mucus has since lightened in color.  She has been managing her symptoms at home with over-the-counter cough and cold medicine, which she ran out of, and has also used a humidifier on a couple of nights. She has been using a nasal spray since her last visit, although she is unsure of its effectiveness.  No fever, decreased appetite, nausea, vomiting, or diarrhea. She feels pressure in her ears when blowing her nose due to thick mucus but denies any pressure in her hands or eyes.  She has been using Mucinex  to help thin secretions but acknowledges not drinking enough water .          Relevant past medical, surgical, family, and social history reviewed and updated as indicated.  Allergies and medications reviewed and updated. Data reviewed: Chart in Epic.   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    Heart murmur    History of thyroid  nodule    benign, left   Hyperlipidemia     Hypertension    Mitral valve prolapse    Vitamin D  deficiency     Past Surgical History:  Procedure Laterality Date   BASAL CELL CARCINOMA EXCISION     COLONOSCOPY N/A 07/29/2019   Procedure: COLONOSCOPY;  Surgeon: Shaaron Lamar HERO, MD;  Location: [facility];  Service: Endoscopy;  Laterality: N/A;  10:30   POLYPECTOMY  07/29/2019   Procedure: POLYPECTOMY;  Surgeon: Shaaron Lamar HERO, MD;  Location: [facility];  Service: Endoscopy;;  recto-sigmoid    Social History   Socioeconomic History   Marital status: Married    Spouse name: Elsie   Number of children: 2   Years of education: Not on file   Highest education level: Associate degree: academic program  Occupational History   Occupation: Magazine Features Editor  Tobacco Use   Smoking status: Never   Smokeless tobacco: Never  Vaping Use   Vaping status: Never Used  Substance and Sexual Activity   Alcohol use: Yes    Comment: social   Drug use: Never   Sexual activity: Not on file  Other Topics Concern   Not on file  Social History Narrative   Not on file   Social Drivers of Health   Tobacco Use: Low Risk (08/10/2024)   Patient History    Smoking Tobacco Use: Never    Smokeless Tobacco Use: Never    Passive Exposure: Not on file  Financial Resource Strain:  Low Risk (02/26/2024)   Overall Financial Resource Strain (CARDIA)    Difficulty of Paying Living Expenses: Not hard at all  Food Insecurity: No Food Insecurity (02/26/2024)   Epic    Worried About Programme Researcher, Broadcasting/film/video in the Last Year: Never true    Ran Out of Food in the Last Year: Never true  Transportation Needs: No Transportation Needs (02/26/2024)   Epic    Lack of Transportation (Medical): No    Lack of Transportation (Non-Medical): No  Physical Activity: Inactive (02/26/2024)   Exercise Vital Sign    Days of Exercise per Week: 0 days    Minutes of Exercise per Session: Not on file  Stress: No Stress Concern Present (02/26/2024)   [employer] of Occupational  Health - Occupational Stress Questionnaire    Feeling of Stress: Only a little  Social Connections: Socially Isolated (02/26/2024)   Social Connection and Isolation Panel    Frequency of Communication with Friends and Family: Once a week    Frequency of Social Gatherings with Friends and Family: Once a week    Attends Religious Services: Never    Database Administrator or Organizations: No    Attends Engineer, Structural: Not on file    Marital Status: Married  Intimate Partner Violence: Unknown (10/17/2021)   Received from [facility]   HITS    Physically Hurt: Not on file    Insult or Talk Down To: Not on file    Threaten Physical Harm: Not on file    Scream or Curse: Not on file  Depression (PHQ2-9): Low Risk (09/18/2023)   Depression (PHQ2-9)    PHQ-2 Score: 2  Alcohol Screen: Low Risk (02/26/2024)   Alcohol Screen    Last Alcohol Screening Score (AUDIT): 1  Housing: Low Risk (02/26/2024)   Epic    Unable to Pay for Housing in the Last Year: No    Number of Times Moved in the Last Year: 0    Homeless in the Last Year: No  Utilities: Not on file  Health Literacy: Not on file    Outpatient Encounter Medications as of 08/10/2024  Medication Sig   amoxicillin -clavulanate (AUGMENTIN ) 875-125 MG tablet Take 1 tablet by mouth 2 (two) times daily.   atorvastatin  (LIPITOR) 20 MG tablet TAKE 1 TABLET DAILY   Calcium  Carb-Cholecalciferol (CALCIUM  600+D) 600-800 MG-UNIT TABS Take 1 tablet by mouth daily.   cholecalciferol (VITAMIN D3) 25 MCG (1000 UNIT) tablet Take 2,000 Units by mouth daily.   losartan -hydrochlorothiazide  (HYZAAR) 50-12.5 MG tablet Take 1 tablet by mouth daily.   Multiple Vitamin (MULTIVITAMIN WITH MINERALS) TABS tablet Take 1 tablet by mouth daily.   [DISCONTINUED] mometasone  (NASONEX ) 50 MCG/ACT nasal spray Place 2 sprays into the nose daily.   mometasone  (NASONEX ) 50 MCG/ACT nasal spray Place 2 sprays into the nose daily.   [DISCONTINUED] acetaminophen  (TYLENOL) 500 MG tablet Take 500-1,000 mg by mouth every 6 (six) hours as needed (for pain/headaches.).   [DISCONTINUED] Bilberry 100 MG CAPS Take 1,000 mg by mouth daily.   No facility-administered encounter medications on file as of 08/10/2024.    Allergies[1]  Pertinent ROS per HPI, otherwise unremarkable      Objective:  BP (!) 154/94   Pulse 76   Temp (!) 96.9 F (36.1 C)   Ht 5' 3 (1.6 m)   Wt 185 lb 12.8 oz (84.3 kg)   SpO2 98%   BMI 32.91 kg/m    Wt Readings from Last 3  Encounters:  08/10/24 185 lb 12.8 oz (84.3 kg)  03/25/24 185 lb 9.6 oz (84.2 kg)  09/18/23 198 lb 9.6 oz (90.1 kg)    Physical Exam Vitals and nursing note reviewed.  Constitutional:      General: She is not in acute distress.    Appearance: She is ill-appearing. She is not toxic-appearing or diaphoretic.  HENT:     Head: Normocephalic and atraumatic.     Right Ear: A middle ear effusion is present. Tympanic membrane is not erythematous.     Left Ear: A middle ear effusion is present. Tympanic membrane is not erythematous.     Nose: Congestion present.     Right Turbinates: Enlarged.     Left Turbinates: Enlarged.     Right Sinus: Maxillary sinus tenderness and frontal sinus tenderness present.     Left Sinus: Maxillary sinus tenderness and frontal sinus tenderness present.     Mouth/Throat:     Lips: Pink.     Mouth: Mucous membranes are moist.     Pharynx: Posterior oropharyngeal erythema and postnasal drip present. No oropharyngeal exudate.  Eyes:     Conjunctiva/sclera: Conjunctivae normal.     Pupils: Pupils are equal, round, and reactive to light.  Cardiovascular:     Rate and Rhythm: Normal rate and regular rhythm.     Heart sounds: Normal heart sounds.  Pulmonary:     Effort: Pulmonary effort is normal.     Breath sounds: Normal breath sounds.  Musculoskeletal:     Cervical back: Neck supple.     Right lower leg: No edema.     Left lower leg: No edema.  Lymphadenopathy:      Cervical: Cervical adenopathy present.  Skin:    General: Skin is warm and dry.     Capillary Refill: Capillary refill takes less than 2 seconds.  Neurological:     General: No focal deficit present.     Mental Status: She is alert and oriented to person, place, and time.  Psychiatric:        Mood and Affect: Mood normal.        Behavior: Behavior normal.        Thought Content: Thought content normal.        Judgment: Judgment normal.       Results for orders placed or performed in visit on 03/25/24  VITAMIN D  25 Hydroxy (Vit-D Deficiency, Fractures)   Collection Time: 03/25/24 10:44 AM  Result Value Ref Range   Vit D, 25-Hydroxy 68.9 30.0 - 100.0 ng/mL  Lipid panel   Collection Time: 03/25/24 10:44 AM  Result Value Ref Range   Cholesterol, Total 149 100 - 199 mg/dL   Triglycerides 898 0 - 149 mg/dL   HDL 57 >60 mg/dL   VLDL Cholesterol Cal 18 5 - 40 mg/dL   LDL Chol Calc (NIH) 74 0 - 99 mg/dL   Chol/HDL Ratio 2.6 0.0 - 4.4 ratio       Pertinent labs & imaging results that were available during my care of the patient were reviewed by me and considered in my medical decision making.  Assessment & Plan:  Katherine Pearson was seen today for cough and nasal congestion.  Diagnoses and all orders for this visit:  Acute non-recurrent pansinusitis -     amoxicillin -clavulanate (AUGMENTIN ) 875-125 MG tablet; Take 1 tablet by mouth 2 (two) times daily.  Seasonal allergic rhinitis due to pollen -     mometasone  (NASONEX ) 50 MCG/ACT nasal spray;  Place 2 sprays into the nose daily.       Acute pansinusitis Symptoms began last Wednesday with sneezing, followed by coughing and mucous production. No fever, decreased appetite, nausea, vomiting, or diarrhea. Postnasal drainage and pressure sensation likely due to fluid buildup behind the eardrum. Differential includes sinusitis due to thick and gunky nasal secretions. - Prescribed Augmentin  to be taken twice a day for ten days. -  Continue Mucinex  with increased water  intake. - Continue nasal spray (Nasonex ) as previously prescribed. - Advised against further use of cold and cough medications due to potential impact on blood pressure. - Recommended quercetin if cold and cough medications are needed.  Elevated blood pressure Blood pressure likely elevated due to OTC cold and cough medications.  - Monitor blood pressure at home. - Advised to contact the provider if blood pressure does not decrease.          Continue all other maintenance medications.  Follow up plan: Return if symptoms worsen or fail to improve.   Continue healthy lifestyle choices, including diet (rich in fruits, vegetables, and lean proteins, and low in salt and simple carbohydrates) and exercise (at least 30 minutes of moderate physical activity daily).  Educational handout given for sinusitis   The above assessment and management plan was discussed with the patient. The patient verbalized understanding of and has agreed to the management plan. Patient is aware to call the clinic if they develop any new symptoms or if symptoms persist or worsen. Patient is aware when to return to the clinic for a follow-up visit. Patient educated on when it is appropriate to go to the emergency department.   Rosaline Bruns, FNP-C [facility] [phone]     [1]  Allergies Allergen Reactions   Cefuroxime Axetil Rash   Lisinopril -Hydrochlorothiazide  Cough   "

## 2024-09-30 ENCOUNTER — Encounter: Payer: Self-pay | Admitting: Family Medicine
